# Patient Record
Sex: Male | Born: 1965 | Race: White | Hispanic: No | Marital: Single | Smoking: Never smoker
Health system: Southern US, Community
[De-identification: ages and names within clinical notes are randomized; demographics above are authoritative.]

## PROBLEM LIST (undated history)

## (undated) DIAGNOSIS — E119 Type 2 diabetes mellitus without complications: Secondary | ICD-10-CM

---

## 1999-09-21 ENCOUNTER — Emergency Department (HOSPITAL_COMMUNITY): Admission: EM | Admit: 1999-09-21 | Discharge: 1999-09-21 | Payer: Self-pay | Admitting: *Deleted

## 1999-09-22 ENCOUNTER — Encounter: Payer: Self-pay | Admitting: Emergency Medicine

## 1999-10-13 ENCOUNTER — Encounter: Admission: RE | Admit: 1999-10-13 | Discharge: 2000-01-11 | Payer: Self-pay | Admitting: Family Medicine

## 2017-12-15 ENCOUNTER — Observation Stay
Admission: EM | Admit: 2017-12-15 | Discharge: 2017-12-16 | Disposition: A | Discharge status: Home / Self Care | Payer: 59 | Attending: Internal Medicine | Admitting: Internal Medicine

## 2017-12-15 ENCOUNTER — Other Ambulatory Visit: Payer: Self-pay

## 2017-12-15 ENCOUNTER — Inpatient Hospital Stay: Payer: 59

## 2017-12-15 DIAGNOSIS — Z79899 Other long term (current) drug therapy: Secondary | ICD-10-CM | POA: Diagnosis not present

## 2017-12-15 DIAGNOSIS — E11621 Type 2 diabetes mellitus with foot ulcer: Secondary | ICD-10-CM | POA: Diagnosis not present

## 2017-12-15 DIAGNOSIS — I10 Essential (primary) hypertension: Secondary | ICD-10-CM | POA: Insufficient documentation

## 2017-12-15 DIAGNOSIS — Z7984 Long term (current) use of oral hypoglycemic drugs: Secondary | ICD-10-CM | POA: Insufficient documentation

## 2017-12-15 DIAGNOSIS — L03115 Cellulitis of right lower limb: Secondary | ICD-10-CM | POA: Insufficient documentation

## 2017-12-15 DIAGNOSIS — M869 Osteomyelitis, unspecified: Secondary | ICD-10-CM

## 2017-12-15 DIAGNOSIS — L089 Local infection of the skin and subcutaneous tissue, unspecified: Secondary | ICD-10-CM | POA: Diagnosis present

## 2017-12-15 DIAGNOSIS — E785 Hyperlipidemia, unspecified: Secondary | ICD-10-CM | POA: Insufficient documentation

## 2017-12-15 DIAGNOSIS — Z8249 Family history of ischemic heart disease and other diseases of the circulatory system: Secondary | ICD-10-CM | POA: Diagnosis not present

## 2017-12-15 DIAGNOSIS — L97519 Non-pressure chronic ulcer of other part of right foot with unspecified severity: Secondary | ICD-10-CM | POA: Diagnosis not present

## 2017-12-15 HISTORY — DX: Type 2 diabetes mellitus without complications: E11.9

## 2017-12-15 LAB — COMPREHENSIVE METABOLIC PANEL
ALBUMIN: 4.1 g/dL (ref 3.5–5.0)
ALT: 19 U/L (ref 0–44)
AST: 21 U/L (ref 15–41)
Alkaline Phosphatase: 90 U/L (ref 38–126)
Anion gap: 11 (ref 5–15)
BUN: 14 mg/dL (ref 6–20)
CHLORIDE: 103 mmol/L (ref 98–111)
CO2: 24 mmol/L (ref 22–32)
Calcium: 9.2 mg/dL (ref 8.9–10.3)
Creatinine, Ser: 0.89 mg/dL (ref 0.61–1.24)
GFR calc Af Amer: >60 mL/min (ref >60)
Glucose, Bld: 170 mg/dL — ABNORMAL HIGH (ref 70–99)
Potassium: 3.9 mmol/L (ref 3.5–5.1)
Sodium: 138 mmol/L (ref 135–145)
Total Bilirubin: 1.2 mg/dL (ref 0.3–1.2)
Total Protein: 8.5 g/dL — ABNORMAL HIGH (ref 6.5–8.1)

## 2017-12-15 LAB — CBC WITH DIFFERENTIAL/PLATELET
Basophils Absolute: 0 10*3/uL (ref 0–0.1)
Basophils Relative: 0 %
EOS PCT: 0 %
Eosinophils Absolute: 0 10*3/uL (ref 0–0.7)
HEMATOCRIT: 43.8 % (ref 40.0–52.0)
HEMOGLOBIN: 15.1 g/dL (ref 13.0–18.0)
LYMPHS ABS: 1.2 10*3/uL (ref 1.0–3.6)
LYMPHS PCT: 5 %
MCH: 30.2 pg (ref 26.0–34.0)
MCHC: 34.5 g/dL (ref 32.0–36.0)
MCV: 87.5 fL (ref 80.0–100.0)
Monocytes Absolute: 1.6 10*3/uL — ABNORMAL HIGH (ref 0.2–1.0)
Monocytes Relative: 7 %
Neutro Abs: 21.3 10*3/uL — ABNORMAL HIGH (ref 1.4–6.5)
Neutrophils Relative %: 88 %
PLATELETS: 278 10*3/uL (ref 150–440)
RBC: 5.01 MIL/uL (ref 4.40–5.90)
RDW: 13.4 % (ref 11.5–14.5)
WBC: 24.2 10*3/uL — AB (ref 3.8–10.6)

## 2017-12-15 LAB — GLUCOSE, CAPILLARY
GLUCOSE-CAPILLARY: 125 mg/dL — AB (ref 70–99)
GLUCOSE-CAPILLARY: 105 mg/dL — AB (ref 70–99)
Glucose-Capillary: 161 mg/dL — ABNORMAL HIGH (ref 70–99)
Glucose-Capillary: 141 mg/dL — ABNORMAL HIGH (ref 70–99)

## 2017-12-15 LAB — HEMOGLOBIN A1C
Hgb A1c MFr Bld: 7.9 % — ABNORMAL HIGH (ref 4.8–5.6)
Mean Plasma Glucose: 180.03 mg/dL

## 2017-12-15 LAB — LACTIC ACID, PLASMA
Lactic Acid, Venous: 1.5 mmol/L (ref 0.5–1.9)
Lactic Acid, Venous: 1.4 mmol/L (ref 0.5–1.9)

## 2017-12-15 MED ORDER — POLYETHYLENE GLYCOL 3350 17 G PO PACK: 17.0000 g | PACK | Freq: Every day | ORAL | Status: DC | PRN

## 2017-12-15 MED ORDER — ONDANSETRON HCL 4 MG/2ML IJ SOLN: 4.0000 mg | Freq: Four times a day (QID) | INTRAMUSCULAR | Status: DC | PRN

## 2017-12-15 MED ORDER — ACETAMINOPHEN 650 MG RE SUPP: 650.0000 mg | Freq: Four times a day (QID) | RECTAL | Status: DC | PRN

## 2017-12-15 MED ORDER — LINAGLIPTIN 5 MG PO TABS
5.0000 mg | ORAL_TABLET | Freq: Every day | ORAL | Status: DC
  Administered 2017-12-16: 5 mg via ORAL
  Filled 2017-12-15: qty 1

## 2017-12-15 MED ORDER — VANCOMYCIN HCL IN DEXTROSE 1-5 GM/200ML-% IV SOLN
1000.0000 mg | Freq: Once | INTRAVENOUS | Status: AC
  Administered 2017-12-15: 1000 mg via INTRAVENOUS
  Filled 2017-12-15: qty 200

## 2017-12-15 MED ORDER — PIPERACILLIN-TAZOBACTAM 3.375 G IVPB 30 MIN: 3.3750 g | Freq: Four times a day (QID) | INTRAVENOUS | Status: DC

## 2017-12-15 MED ORDER — PIPERACILLIN-TAZOBACTAM 3.375 G IVPB
3.3750 g | Freq: Three times a day (TID) | INTRAVENOUS | Status: DC
  Administered 2017-12-15 – 2017-12-16 (×2): 3.375 g via INTRAVENOUS
  Filled 2017-12-15 (×3): qty 50

## 2017-12-15 MED ORDER — ATORVASTATIN CALCIUM 10 MG PO TABS
10.0000 mg | ORAL_TABLET | Freq: Every day | ORAL | Status: DC
  Filled 2017-12-15: qty 1

## 2017-12-15 MED ORDER — ENOXAPARIN SODIUM 40 MG/0.4ML ~~LOC~~ SOLN
40.0000 mg | SUBCUTANEOUS | Status: DC
  Administered 2017-12-15: 40 mg via SUBCUTANEOUS
  Filled 2017-12-15: qty 0.4

## 2017-12-15 MED ORDER — ACETAMINOPHEN 325 MG PO TABS: 650.0000 mg | ORAL_TABLET | Freq: Four times a day (QID) | ORAL | Status: DC | PRN

## 2017-12-15 MED ORDER — ONDANSETRON HCL 4 MG PO TABS: 4.0000 mg | ORAL_TABLET | Freq: Four times a day (QID) | ORAL | Status: DC | PRN

## 2017-12-15 MED ORDER — INSULIN ASPART 100 UNIT/ML ~~LOC~~ SOLN: 0.0000 [IU] | Freq: Every day | SUBCUTANEOUS | Status: DC

## 2017-12-15 MED ORDER — HYDRALAZINE HCL 20 MG/ML IJ SOLN: 10.0000 mg | INTRAMUSCULAR | Status: DC | PRN

## 2017-12-15 MED ORDER — LISINOPRIL 5 MG PO TABS
5.0000 mg | ORAL_TABLET | Freq: Every day | ORAL | Status: DC
  Filled 2017-12-15: qty 1

## 2017-12-15 MED ORDER — HYDROCODONE-ACETAMINOPHEN 5-325 MG PO TABS: 1.0000 | ORAL_TABLET | ORAL | Status: DC | PRN

## 2017-12-15 MED ORDER — VANCOMYCIN HCL 10 G IV SOLR
1250.0000 mg | Freq: Three times a day (TID) | INTRAVENOUS | Status: DC
  Administered 2017-12-15 – 2017-12-16 (×3): 1250 mg via INTRAVENOUS
  Filled 2017-12-15 (×5): qty 1250

## 2017-12-15 MED ORDER — INSULIN ASPART 100 UNIT/ML ~~LOC~~ SOLN
0.0000 [IU] | Freq: Three times a day (TID) | SUBCUTANEOUS | Status: DC
  Administered 2017-12-15: 2 [IU] via SUBCUTANEOUS
  Filled 2017-12-15: qty 1

## 2017-12-15 MED ORDER — PIPERACILLIN-TAZOBACTAM 3.375 G IVPB 30 MIN
3.3750 g | Freq: Once | INTRAVENOUS | Status: AC
  Administered 2017-12-15: 3.375 g via INTRAVENOUS
  Filled 2017-12-15: qty 50

## 2017-12-15 NOTE — Progress Notes (Signed)
CODE SEPSIS - PHARMACY COMMUNICATION  **Broad Spectrum Antibiotics should be administered within 1 hour of Sepsis diagnosis**  Time Code Sepsis Called/Page Received: 1207  Antibiotics Ordered: vancomycin, Zosyn  Time of 1st antibiotic administration: 1236  Additional action taken by pharmacy: none required  If necessary, Name of Provider/Nurse Contacted: N/A    Lowella Bandyodney D Azariyah Luhrs ,PharmD Clinical Pharmacist  12/15/2017  12:48 PM

## 2017-12-15 NOTE — Consult Note (Signed)
[facility]                                                      Patient Demographics  Casey Figueroa, is a 52 y.o. male   MRN: [id number]   DOB - 25-Aug-1965  Admit Date - 12/15/2017    Outpatient Primary MD for the patient is [facility] [place] requested in the Hospital by Salary, Avel Peace, MD, On 12/15/2017    Reason for consult diabetic foot infection right   With History of -  Past Medical History:  Diagnosis Date  . Diabetes mellitus without complication ([place])       History reviewed. No pertinent surgical history.  in for   Chief Complaint  Patient presents with  . Foot Injury     HPI  Casey Figueroa  is a 52 y.o. male, patient states that the pulled a little callus type material off the bottom of his right foot few days ago on _0 /07/19 1800  vancomycin (VANCOCIN) 1,250 mg in sodium chloride 0.9 %  250 mL IVPB     1,250 mg 166.7 mL/hr over 90 Minutes Intravenous Every 8 hours 12/15/17 1345     12/15/17 1430  piperacillin-tazobactam (ZOSYN) IVPB 3.375 g  Status:  Discontinued     3.375 g 100 mL/hr over 30 Minutes Intravenous Every 6 hours 12/15/17 1418 12/15/17 1425   12/15/17 1215  piperacillin-tazobactam (ZOSYN) IVPB 3.375 g     3.375 g 100 mL/hr over 30 Minutes Intravenous  Once 12/15/17 1206 12/15/17 1345   12/15/17 1215  vancomycin (VANCOCIN) IVPB 1000 mg/200 mL premix     1,000 mg 200 mL/hr over 60 Minutes Intravenous  Once 12/15/17 1206 12/15/17 1349      Scheduled Meds: . atorvastatin  10 mg Oral q1800  . enoxaparin (LOVENOX) injection  40 mg Subcutaneous Q24H  . insulin aspart  0-15 Units Subcutaneous TID  WC  . insulin aspart  0-5 Units Subcutaneous QHS  . [START ON 12/16/2017] linagliptin  5 mg Oral Daily  . [START ON 12/16/2017] lisinopril  5 mg Oral Daily   Continuous Infusions: . piperacillin-tazobactam (ZOSYN)  IV    . vancomycin     PRN Meds:.acetaminophen **OR** acetaminophen, hydrALAZINE, HYDROcodone-acetaminophen, ondansetron **OR** ondansetron (ZOFRAN) IV, polyethylene glycol  No Known Allergies  Physical Exam  Vitals  Blood pressure (!) 158/83, pulse 86, temperature 98.4 F (36.9 C), temperature source Oral, resp. rate 18, height _0  (1.93 m), weight 104.3 kg (230 lb), SpO2 99 %.  Lower Extremity exam:  Vascular: DP and PT pulses are +2/4 bilateral  Dermatological: Patient has a large blister on the plantar sub-met one area of the right foot there is some cellulitis progressing from that region onto the foot and ankle region.  The blisters partially opened on the medial side and there is just a little bit of drainage there is not a lot of drainage or pus from the region.  Debridement of that loose skin with a 15 scalpel blade was accomplished by me today and excisional process of debridement.  There is no underlying open wound no deep penetration.  Appeared to be very very superficial.  Neurological: Likely peripheral neuropathy associated with diabetes.  Ortho: No gross deformities  Data Review  CBC Recent Labs  Lab 12/15/17 1207  WBC 24.2*  HGB 15.1  HCT 43.8  PLT 278  MCV 87.5  MCH 30.2  MCHC 34.5  RDW 13.4  LYMPHSABS 1.2  MONOABS 1.6*  EOSABS 0.0  BASOSABS 0.0   ------------------------------------------------------------------------------------------------------------------  Chemistries  Recent Labs  Lab 12/15/17 1207  NA 138  K 3.9  CL 103  CO2 24  GLUCOSE 170*  BUN 14  CREATININE 0.89  CALCIUM 9.2  AST 21  ALT 19  ALKPHOS 90  BILITOT 1.2    ------------------------------------------------------------------------------------------------------------------ estimated creatinine clearance is 125.9 mL/min (by C-G formula based on SCr of 0.89 mg/dL). ------------------------------------------------------------------------------------------------------------------ No results for input(s): TSH, T4TOTAL, T3FREE, THYROIDAB in the last 72 hours.  Invalid input(s): FREET3 Urinalysis No results found for: COLORURINE, APPEARANCEUR, LABSPEC, PHURINE, GLUCOSEU, HGBUR, BILIRUBINUR, KETONESUR, PROTEINUR, UROBILINOGEN, NITRITE, LEUKOCYTESUR   Imaging results:   No results found.  Assessment & Plan: White count is notably high.  Cellulitis is present with some inflammation to the region.  Patient denies any fever or chills.  Wound itself is superficial and hopefully will heal in a timely fashion is needs to get this infection under control.  I did take cultures at the day and will send it off.  Some anaerobic culture sent superficial wound.  Currently on Vanco and Zosyn.  I will have a OrthoWedge shoe dispensed to them.  I also ordered x-rays to make sure there is no foreign body.  Also spent time talking to them today about routine diabetic foot care and checkups to make sure he does well and does not have any particular problems with this.  This can be helpful in avoiding problems in the future with daily visual checks. If his infection is stable and significantly improved he can likely go home tomorrow. Active Problems:   Right foot infection   Family Communication: Plan discussed with patient and family.  Albertine Patricia M.D on 12/15/2017 at 5:47 PM  Thank you for the consult, we will follow the patient with you in the Hospital.

## 2017-12-15 NOTE — Progress Notes (Signed)
Pharmacy Antibiotic Note  Casey DraftJohn M Figueroa is a 52 y.o. male admitted on 12/15/2017 with cellulitis.  Pharmacy has been consulted for vancomycin and Zosyn dosing. He has a history of DM,  hypertension, hyperlipidemia, presenting with 4 to 5-day history of worsening right foot infection. Podiatry has been consulted for I&D.  Plan: Vancomycin 1250mg  IV every 8 hours following 1000 mg in the ED after 6 hours.  Goal trough 15-20 mcg/mL. Ke: 0.107, Vd 73L, T1/2: 6.5h, calculated concentrations at steady-state: 32.1/15.1 mcg/mL. Vt prior to the 5th dose Zosyn 3.375g EI q8h  Height: 6\' 4"  (193 cm) Weight: 230 lb (104.3 kg) IBW/kg (Calculated) : 86.8  Temp (24hrs), Avg:98.2 F (36.8 C), Min:98.2 F (36.8 C), Max:98.2 F (36.8 C)  Recent Labs  Lab 12/15/17 1207  WBC 24.2*  CREATININE 0.89  LATICACIDVEN 1.5    Estimated Creatinine Clearance: 125.9 mL/min (by C-G formula based on SCr of 0.89 mg/dL).    No Known Allergies  Antimicrobials this admission: Vancomycin 8/7 >>  Zosyn 8/7 >>   Microbiology results: 8/7 BCx: pending  Thank you for allowing pharmacy to be a part of this patient's care.  Lowella Bandyodney D Angelice Piech, PharmD 12/15/2017 1:03 PM

## 2017-12-15 NOTE — H&P (Signed)
[facility] [place] [facility]   PATIENT NAME: Casey Figueroa    MR#:  [id number]  DATE OF BIRTH:  07/12/1965  DATE OF ADMISSION:  12/15/2017  PRIMARY CARE PHYSICIAN: [facility]   REQUESTING/REFERRING PHYSICIAN:   CHIEF COMPLAINT:   Chief Complaint  Patient presents with  . Foot Injury    HISTORY OF PRESENT ILLNESS: Casey Figueroa  is a 52 y.o. male with a known history of per below which also includes hypertension, hyperlipidemia, presenting with 4 to 5-day history of worsening right foot infection, patient stated that callus on the bottom of his foot came off developed subsequent redness with swelling, in the emergency room patient was noted to have a white count 24,000, patient no apparent distress, resting comfortably in bed, patient now being admitted for acute right diabetic foot infection.  PAST MEDICAL HISTORY:   Past Medical History:  Diagnosis Date  . Diabetes mellitus without complication (HCC)     PAST SURGICAL HISTORY:  None  SOCIAL HISTORY:  Social History   Tobacco Use  . Smoking status: Never Smoker  . Smokeless tobacco: Never Used  Substance Use Topics  . Alcohol use: Not Currently    Frequency: Never    FAMILY HISTORY:  HTN  DRUG ALLERGIES: No Known Allergies  REVIEW OF SYSTEMS:   CONSTITUTIONAL: No fever, fatigue or weakness.  EYES: No blurred or double vision.  EARS, NOSE, AND THROAT: No tinnitus or ear pain.  RESPIRATORY: No cough, shortness of breath, wheezing or hemoptysis.  CARDIOVASCULAR: No chest pain, orthopnea, edema.  GASTROINTESTINAL: No nausea, vomiting, diarrhea or abdominal pain.  GENITOURINARY: No dysuria, hematuria.  ENDOCRINE: No polyuria, nocturia,  HEMATOLOGY: No anemia, easy bruising or bleeding SKIN: Right foot infection  MUSCULOSKELETAL: No joint pain or arthritis.   NEUROLOGIC: No tingling, numbness, weakness.  PSYCHIATRY: No anxiety or depression.   MEDICATIONS AT HOME:  Prior to  Admission medications   Medication Sig Start Date End Date Taking? Authorizing Provider  atorvastatin (LIPITOR) 10 MG tablet Take 1 tablet by mouth daily. 12/14/17  Yes [provider]  JARDIANCE 25 MG TABS tablet Take 1 tablet by mouth daily. 12/02/17  Yes [provider]  lisinopril (PRINIVIL,ZESTRIL) 5 MG tablet Take 1 tablet by mouth daily. 12/13/17  Yes [provider]  metFORMIN (GLUCOPHAGE) 1000 MG tablet Take 1 tablet by mouth 2 (two) times daily. 12/13/17  Yes [provider]  TRADJENTA 5 MG TABS tablet Take 5 mg by mouth daily. 09/29/17  Yes [provider]      PHYSICAL EXAMINATION:   VITAL SIGNS: Blood pressure (!) 142/79, pulse 86, temperature 98.2 F (36.8 C), temperature source Oral, resp. rate 18, height 6\' 4"  (1.93 m), weight 104.3 kg (230 lb), SpO2 96 %.  GENERAL:  52 y.o.-year-old patient lying in the bed with no acute distress.  EYES: Pupils equal, round, reactive to light and accommodation. No scleral icterus. Extraocular muscles intact.  HEENT: Head atraumatic, normocephalic. Oropharynx and nasopharynx clear.  NECK:  Supple, no jugular venous distention. No thyroid enlargement, no tenderness.  LUNGS: Normal breath sounds bilaterally, no wheezing, rales,rhonchi or crepitation. No use of accessory muscles of respiration.  CARDIOVASCULAR: S1, S2 normal. No murmurs, rubs, or gallops.  ABDOMEN: Soft, nontender, nondistended. Bowel sounds present. No organomegaly or mass.  EXTREMITIES: No pedal edema, cyanosis, or clubbing.  NEUROLOGIC: Cranial nerves II through XII are intact. Muscle strength 5/5 in all extremities. Sensation intact. Gait not checked.  PSYCHIATRIC: The  patient is alert and oriented x 3.  SKIN: Acute diabetic foot infection on the right with associated cellulitis extending to the knee, noted drainage from callus on the bottom of the foot    LABORATORY PANEL:   CBC Recent Labs  Lab 12/15/17 1207  WBC 24.2*  HGB  15.1  HCT 43.8  PLT 278  MCV 87.5  MCH 30.2  MCHC 34.5  RDW 13.4  LYMPHSABS 1.2  MONOABS 1.6*  EOSABS 0.0  BASOSABS 0.0   ------------------------------------------------------------------------------------------------------------------  Chemistries  Recent Labs  Lab 12/15/17 1207  NA 138  K 3.9  CL 103  CO2 24  GLUCOSE 170*  BUN 14  CREATININE 0.89  CALCIUM 9.2  AST 21  ALT 19  ALKPHOS 90  BILITOT 1.2   ------------------------------------------------------------------------------------------------------------------ estimated creatinine clearance is 125.9 mL/min (by C-G formula based on SCr of 0.89 mg/dL). ------------------------------------------------------------------------------------------------------------------ No results for input(s): TSH, T4TOTAL, T3FREE, THYROIDAB in the last 72 hours.  Invalid input(s): FREET3   Coagulation profile No results for input(s): INR, PROTIME in the last 168 hours. ------------------------------------------------------------------------------------------------------------------- No results for input(s): DDIMER in the last 72 hours. -------------------------------------------------------------------------------------------------------------------  Cardiac Enzymes No results for input(s): CKMB, TROPONINI, MYOGLOBIN in the last 168 hours.  Invalid input(s): CK ------------------------------------------------------------------------------------------------------------------ Invalid input(s): POCBNP  ---------------------------------------------------------------------------------------------------------------  Urinalysis No results found for: COLORURINE, APPEARANCEUR, LABSPEC, PHURINE, GLUCOSEU, HGBUR, BILIRUBINUR, KETONESUR, PROTEINUR, UROBILINOGEN, NITRITE, LEUKOCYTESUR   RADIOLOGY: No results found.  EKG: No orders found for this or any previous visit.  IMPRESSION AND PLAN: *Acute right diabetic foot  infection Admit to regular nursing floor bed, empiric vancomycin/Zosyn, check wound cultures, follow-up on cultures, podiatry consulted for I&D evaluation  *Chronic diabetes mellitus type 2 Continue home regiment, sliding scale insulin with Accu-Cheks per routine, check hemoglobin C determine level control  *Chronic hypertension Stable Continue home regiment  *Chronic hyperlipidemia, unspecified Stable Continue statin therapy and check lipids in the morning   All the records are reviewed and case discussed with ED provider. Management plans discussed with the patient, family and they are in agreement.  CODE STATUS:full    TOTAL TIME TAKING CARE OF THIS PATIENT: 45 minutes.    Evelena Asa Salary M.D on 12/15/2017   Between 7am to 6pm - Pager - [phone]  After 6pm go to [email or web address] - password [email or web address]  [facility]  Office  [phone]  CC: Primary care physician; [facility]   Note: This dictation was prepared with Dragon dictation along with smaller phrase technology. Any transcriptional errors that result from this process are unintentional.

## 2017-12-15 NOTE — Progress Notes (Signed)
Family Meeting Note  Advance Directive:yes  Today a meeting took place with the Patient.  Patient is able to participate   The following clinical team members were present during this meeting:MD  The following were discussed:Patient's diagnosis: Diabetic foot wound, diabetes, hypertension, hyperlipidemia, Patient's progosis: Unable to determine and Goals for treatment: Full Code  Additional follow-up to be provided: prn  Time spent during discussion:20 minutes  Bertrum SolMontell D Shemar Plemmons, MD

## 2017-12-15 NOTE — ED Triage Notes (Signed)
Pt states he came from riding horses on Saturday, when he was taking his boots off a callus came off and has been putting oinment on it. States he is diabetic.pt has a wound to the left side of foot..Marland Kitchen

## 2017-12-15 NOTE — Progress Notes (Signed)
ADMISSION NOTE:  Pt admitted to room 134 from ED. Pt alert and oriented X4. No complaints of pain. Family at bedside. Right leg elevated on pillow. Pt oriented to room and staff. No needs at this time. Bed in lowest position and call in reach.

## 2017-12-15 NOTE — ED Provider Notes (Signed)
[facility] Emergency Department Provider Note ___________________________________________   First MD Initiated Contact with Patient 12/15/17 1127     (approximate)  I have reviewed the triage vital signs and the nursing notes.   HISTORY  Chief Complaint Foot Injury  HPI EARNIE BECHARD is a 52 y.o. male with a history of diabetes was presented to the emergency department with a right lower extremity swelling and redness.  He says that he had kept his shoes on for 2 days straight because he was needing to nurse a horse who his mother was not giving milk.  He says when he took his shoes off he noticed that he had bruised a callus on his right foot and that he had pus under the skin and redness streaking up his right leg.  Denies any fever.  Patient denies having neuropathy.  Denies pain to the right lower extremity.  Past Medical History:  Diagnosis Date  . Diabetes mellitus without complication (HCC)     There are no active problems to display for this patient.   History reviewed. No pertinent surgical history.  Prior to Admission medications   Medication Sig Start Date End Date Taking? Authorizing Provider  atorvastatin (LIPITOR) 10 MG tablet Take 1 tablet by mouth daily. 12/14/17  Yes [provider]  JARDIANCE 25 MG TABS tablet Take 1 tablet by mouth daily. 12/02/17  Yes [provider]  lisinopril (PRINIVIL,ZESTRIL) 5 MG tablet Take 1 tablet by mouth daily. 12/13/17  Yes [provider]  metFORMIN (GLUCOPHAGE) 1000 MG tablet Take 1 tablet by mouth 2 (two) times daily. 12/13/17  Yes [provider]  TRADJENTA 5 MG TABS tablet Take 5 mg by mouth daily. 09/29/17  Yes [provider]    Allergies Patient has no known allergies.  No family history on file.  Social History Social History   Tobacco Use  . Smoking status: Never Smoker  . Smokeless tobacco: Never Used  Substance Use Topics  . Alcohol use: Not  Currently    Frequency: Never  . Drug use: Not on file    Review of Systems  Constitutional: No fever/chills Eyes: No visual changes. ENT: No sore throat. Cardiovascular: Denies chest pain. Respiratory: Denies shortness of breath. Gastrointestinal: No abdominal pain.  No nausea, no vomiting.  No diarrhea.  No constipation. Genitourinary: Negative for dysuria. Musculoskeletal: Negative for back pain. Skin: As above Neurological: Negative for headaches, focal weakness or numbness.   ____________________________________________   PHYSICAL EXAM:  VITAL SIGNS: ED Triage Vitals [12/15/17 1115]  Enc Vitals Group     BP (!) 142/79     Pulse Rate 86     Resp 18     Temp 98.2 F (36.8 C)     Temp Source Oral     SpO2 96 %     Weight 230 lb (104.3 kg)     Height 6\' 4"  (1.93 m)     Head Circumference      Peak Flow      Pain Score 4     Pain Loc      Pain Edu?      Excl. in GC?     Constitutional: Alert and oriented. Well appearing and in no acute distress. Eyes: Conjunctivae are normal.  Head: Atraumatic. Nose: No congestion/rhinnorhea. Mouth/Throat: Mucous membranes are moist.  Neck: No stridor.   Cardiovascular: Normal rate, regular rhythm. Grossly normal heart sounds.  Respiratory: Normal respiratory effort.  No retractions. Lungs CTAB. Gastrointestinal:  Soft and nontender. No distention.  Musculoskeletal: Moderate edema to the right lower extremity extending from the foot up to the knee. Neurologic:  Normal speech and language. No gross focal neurologic deficits are appreciated. Skin:   To the plantar surface under the right MTPJ there is an area approximately 3 x 4 cm with visualized pus under it.  It appears that this is an area of callus.  Medial to this there is a small amount of dried blood the skin is slightly broken.  There is then streaking erythema from the foot all the way to just proximal to the right knee.   Psychiatric: Mood and affect are normal.  Speech and behavior are normal.  ____________________________________________   LABS (all labs ordered are listed, but only abnormal results are displayed)  Labs Reviewed  GLUCOSE, CAPILLARY - Abnormal; Notable for the following components:      Result Value   Glucose-Capillary 161 (*)    All other components within normal limits  COMPREHENSIVE METABOLIC PANEL - Abnormal; Notable for the following components:   Glucose, Bld 170 (*)    Total Protein 8.5 (*)    All other components within normal limits  CBC WITH DIFFERENTIAL/PLATELET - Abnormal; Notable for the following components:   WBC 24.2 (*)    Neutro Abs 21.3 (*)    Monocytes Absolute 1.6 (*)    All other components within normal limits  CULTURE, BLOOD (ROUTINE X 2)  CULTURE, BLOOD (ROUTINE X 2)  LACTIC ACID, PLASMA  LACTIC ACID, PLASMA   ____________________________________________  EKG   ____________________________________________  RADIOLOGY  ____________________________________________   PROCEDURES  Procedure(s) performed:   Procedures  Critical Care performed:   ____________________________________________   INITIAL IMPRESSION / ASSESSMENT AND PLAN / ED COURSE  Pertinent labs & imaging results that were available during my care of the patient were reviewed by me and considered in my medical decision making (see chart for details).  DDX: Cellulitis, sepsis, kidney failure, hyperglycemia As part of my medical decision making, I reviewed the following data within the electronic MEDICAL RECORD NUMBER Notes from prior ED visits  ----------------------------------------- 12:42 PM on 12/15/2017 -----------------------------------------  Patient at this time found to have a white count of 24.  Patient will need to be admitted to the hospital.  I believe that he has high risk for severe infection and sepsis especially given his diabetes.  Patient aware of need for admission to the hospital.  Signed out to Dr.  Katheren ShamsSalary. ____________________________________________   FINAL CLINICAL IMPRESSION(S) / ED DIAGNOSES  Right lower extremity cellulitis.  NEW MEDICATIONS STARTED DURING THIS VISIT:  New Prescriptions   No medications on file     Note:  This document was prepared using Dragon voice recognition software and may include unintentional dictation errors.     Myrna BlazerSchaevitz, David Matthew, MD 12/15/17 [id number]

## 2017-12-16 LAB — CBC
HEMATOCRIT: 40.7 % (ref 40.0–52.0)
HEMOGLOBIN: 14 g/dL (ref 13.0–18.0)
MCH: 30.4 pg (ref 26.0–34.0)
MCHC: 34.4 g/dL (ref 32.0–36.0)
MCV: 88.4 fL (ref 80.0–100.0)
Platelets: 261 10*3/uL (ref 150–440)
RBC: 4.6 MIL/uL (ref 4.40–5.90)
RDW: 13.2 % (ref 11.5–14.5)
WBC: 20.5 10*3/uL — ABNORMAL HIGH (ref 3.8–10.6)

## 2017-12-16 LAB — GLUCOSE, CAPILLARY
GLUCOSE-CAPILLARY: 88 mg/dL (ref 70–99)
Glucose-Capillary: 98 mg/dL (ref 70–99)

## 2017-12-16 LAB — LIPID PANEL
CHOL/HDL RATIO: 3.2 ratio
Cholesterol: 136 mg/dL (ref 0–200)
HDL: 43 mg/dL (ref >40)
LDL CALC: 75 mg/dL (ref 0–99)
Triglycerides: 92 mg/dL (ref <150)
VLDL: 18 mg/dL (ref 0–40)

## 2017-12-16 LAB — HIV ANTIBODY (ROUTINE TESTING W REFLEX): HIV Screen 4th Generation wRfx: NONREACTIVE

## 2017-12-16 MED ORDER — CIPROFLOXACIN HCL 500 MG PO TABS: 500.0000 mg | ORAL_TABLET | Freq: Two times a day (BID) | ORAL | 0 refills | Status: AC

## 2017-12-16 MED ORDER — AMOXICILLIN-POT CLAVULANATE 875-125 MG PO TABS: 1.0000 | ORAL_TABLET | Freq: Two times a day (BID) | ORAL | 0 refills | Status: AC

## 2017-12-16 NOTE — Progress Notes (Signed)
[facility]                                                      Patient Demographics  Casey Figueroa, is a 52 y.o. male   MRN: [id number]   DOB - 09/12/1965  Admit Date - 12/15/2017    Outpatient Primary MD for the patient is [facility]  Consult requested in the Hospital by Mody, Sital, MD, On 12/16/2017   With History of -  Past Medical History:  Diagnosis Date  . Diabetes mellitus without complication (HCC)       History reviewed. No pertinent surgical history.  in for   Chief Complaint  Patient presents with  . Foot Injury     HPI  Casey Figueroa  is a 52 y.o. male, 1 day status post debridement of the blister and ulcer on the right foot sub-met one area.  He is diabetic and his white count is come down some today.    Review of Systems    In addition to the HPI above,  No Fever-chills, No Headache, No changes with Vision or hearing, No problems swallowing food or Liquids, No Chest pain, Cough or Shortness of Breath, No Abdominal pain, No Nausea or Vommitting, Bowel movements are regular, No Blood in stool or Urine, No dysuria, No new skin rashes or bruises, No new joints pains-aches,  No new weakness, tingling, numbness in any extremity, No recent weight gain or loss, No polyuria, polydypsia or polyphagia, No significant Mental Stressors.  A full 10 point Review of Systems was done, except as stated above, all other Review of Systems were negative.   Social History Social History   Tobacco Use  . Smoking status: Never Smoker  . Smokeless tobacco: Never Used  Substance Use Topics  . Alcohol use: Not Currently    Frequency: Never    Family History No family history on file.  Prior to Admission medications   Medication Sig Start Date End Date Taking? Authorizing Provider  atorvastatin (LIPITOR) 10 MG tablet  Take 1 tablet by mouth daily. 12/14/17  Yes [provider]  JARDIANCE 25 MG TABS tablet Take 1 tablet by mouth daily. 12/02/17  Yes [provider]  lisinopril (PRINIVIL,ZESTRIL) 5 MG tablet Take 1 tablet by mouth daily. 12/13/17  Yes [provider]  metFORMIN (GLUCOPHAGE) 1000 MG tablet Take 1 tablet by mouth 2 (two) times daily. 12/13/17  Yes [provider]  TRADJENTA 5 MG TABS tablet Take 5 mg by mouth daily. 09/29/17  Yes [provider]  amoxicillin-clavulanate (AUGMENTIN) 875-125 MG tablet Take 1 tablet by mouth 2 (two) times daily for 14 days. 12/16/17 12/30/17  Mody, Sital, MD  ciprofloxacin (CIPRO) 500 MG tablet Take 1 tablet (500 mg total) by mouth 2 (two) times daily for 14 days. 12/16/17 12/30/17  Mody, Sital, MD    Anti-infectives (From admission, onward)   Start     Dose/Rate Route Frequency Ordered Stop   12/16/17 0000  ciprofloxacin (CIPRO) 500 MG tablet     500 mg Oral 2 times daily 12/16/17 0922 12/30/17 2359   12/16/17 0000  amoxicillin-clavulanate (AUGMENTIN) 875-125 MG tablet     1 tablet Oral 2 times daily 12/16/17 0922 12/30/17 2359   12/15/17 2000  piperacillin-tazobactam (ZOSYN) IVPB 3.375 g       3.375 g 12.5 mL/hr over 240 Minutes Intravenous Every 8 hours 12/15/17 1345     12/15/17 1800  vancomycin (VANCOCIN) 1,250 mg in sodium chloride 0.9 % 250 mL IVPB     1,250 mg 166.7 mL/hr over 90 Minutes Intravenous Every 8 hours 12/15/17 1345     12/15/17 1430  piperacillin-tazobactam (ZOSYN) IVPB 3.375 g  Status:  Discontinued     3.375 g 100 mL/hr over 30 Minutes Intravenous Every 6 hours 12/15/17 1418 12/15/17 1425   12/15/17 1215  piperacillin-tazobactam (ZOSYN) IVPB 3.375 g     3.375 g 100 mL/hr over 30 Minutes Intravenous  Once 12/15/17 1206 12/15/17 1345   12/15/17 1215  vancomycin (VANCOCIN) IVPB 1000 mg/200 mL premix     1,000 mg 200 mL/hr over 60 Minutes Intravenous  Once 12/15/17 1206 12/15/17 1349      Scheduled Meds: .  atorvastatin  10 mg Oral q1800  . enoxaparin (LOVENOX) injection  40 mg Subcutaneous Q24H  . insulin aspart  0-15 Units Subcutaneous TID WC  . insulin aspart  0-5 Units Subcutaneous QHS  . linagliptin  5 mg Oral Daily  . lisinopril  5 mg Oral Daily   Continuous Infusions: . piperacillin-tazobactam (ZOSYN)  IV Stopped (12/16/17 0915)  . vancomycin Stopped (12/16/17 1036)   PRN Meds:.acetaminophen **OR** acetaminophen, hydrALAZINE, HYDROcodone-acetaminophen, ondansetron **OR** ondansetron (ZOFRAN) IV, polyethylene glycol  No Known Allergies  Physical Exam  Vitals  Blood pressure 130/75, pulse 78, temperature 98.3 F (36.8 C), temperature source Oral, resp. rate 20, height 6' 4" (1.93 m), weight 104.3 kg, SpO2 96 %.  Lower Extremity exam:  Vascular: Palpable bilateral  Dermatological: Plantar wound right foot approximately 2 and half centimeters diameter but it is only to 3 mm deep with a good granular base to the dermal tissues.  Neurological: Some peripheral neuropathy  Ortho: Mild cavovarus foot.  Data Review  CBC Recent Labs  Lab 12/15/17 1207 12/16/17 0409  WBC 24.2* 20.5*  HGB 15.1 14.0  HCT 43.8 40.7  PLT 278 261  MCV 87.5 88.4  MCH 30.2 30.4  MCHC 34.5 34.4  RDW 13.4 13.2  LYMPHSABS 1.2  --   MONOABS 1.6*  --   EOSABS 0.0  --   BASOSABS 0.0  --    ------------------------------------------------------------------------------------------------------------------  Chemistries  Recent Labs  Lab 12/15/17 1207  NA 138  K 3.9  CL 103  CO2 24  GLUCOSE 170*  BUN 14  CREATININE 0.89  CALCIUM 9.2  AST 21  ALT 19  ALKPHOS 90  BILITOT 1.2   ------------------------------------------------------------------------------- Imaging results:   Dg Foot Complete Right  Result Date: 12/15/2017 CLINICAL DATA:  Osteomyelitis. EXAM: RIGHT FOOT COMPLETE - 3+ VIEW COMPARISON:  None. FINDINGS: There is no evidence of fracture or dislocation. There is no evidence of  arthropathy or other focal bone abnormality. Forefoot and midfoot bandage. No subcutaneous gas or radiopaque foreign bodies. Faint sheet like calcification distal plantar soft tissues. No subcutaneous gas or radiopaque foreign bodies. Small calcifications in posterior ankle. Os peroneum. IMPRESSION: No radiographic findings of osteomyelitis or acute osseous process. Electronically Signed   By: Courtnay  Bloomer M.D.   On: 12/15/2017 19:56    Assessment & Plan: Patient is doing well today he does not have the lymphadenopathy that he had yesterday redness and swelling is down considerably in the leg.  Gram stain of the culture showed some positive cocci.  White count is down.  Still somewhat elevated however.  No pain to palpation to the   popliteal fossa and also he states that his groin is no longer sore tender.  He had no fever temperatures and is comfortable alert and well-oriented.  Bandages intact there is little bit of drainage on the region.  Does have his OrthoWedge shoe.   Plan: Redressed the area today with heavy gauze dressing needs to do this on a daily basis.  Is also to wear the OrthoWedge shoe anytime his foot is on the floor where the standing or walking or sitting.  Instructed him how to use that appropriately.  Should take some oral antibiotics until cover staph and strep positive cocci.  I will see him back in my office next week for reevaluation.  Recommend he stay out of work and rest this until I see him next week.  Active Problems:   Right foot infection   Family Communication: Plan discussed with patient   Matthew Troxler M.D on 12/16/2017 at 12:50 PM  Thank you for the consult, we will follow the patient with you in the Hospital.   

## 2017-12-16 NOTE — Care Management Note (Signed)
Case Management Note  Patient Details  Name: Casey Figueroa MRN: [id number] Date of Birth: Oct 13, 1965  Subjective/Objective:     Met with patient at bedside to discuss discharge planning and complete assessment. Patient alert, orient and independent. He is ambulatory and active. He lives with his girlfriend and works full time at [employer]. He states he and his girl friend are very capable of changing his right foot dressing and buying the needed supplies. He verbalized full understanding of foot care and control of his diabetes. He ambulates without difficulty and understands that is is not to ambulate without his orthopedic shoe at any time. Case discussed with attending. RNCM explained that patient does not feel like he is in need of any home health at this time and RNCM is in agreement with this POC. Attending agrees. Case closed.                 Action/Plan:   Expected Discharge Date:  12/16/17               Expected Discharge Plan:  Home/Self Care  In-House Referral:     Discharge planning Services  CM Consult  Post Acute Care Choice:    Choice offered to:     DME Arranged:    DME Agency:     HH Arranged:  Patient Refused [place] Agency:     Status of Service:  Completed, signed off  If discussed at [employer] of Stay Meetings, dates discussed:    Additional Comments:  Jolly Mango, RN 12/16/2017, 9:52 AM

## 2017-12-16 NOTE — Discharge Summary (Signed)
[facility] [place] [facility]   PATIENT NAME: Casey Figueroa    MR#:  [id number]  DATE OF BIRTH:  21-Oct-1965  DATE OF ADMISSION:  12/15/2017 ADMITTING PHYSICIAN: Bertrum Sol, MD  DATE OF DISCHARGE: December 16, 2017  PRIMARY CARE PHYSICIAN: [facility]    ADMISSION DIAGNOSIS:  Cellulitis of right lower extremity [L03.115]  DISCHARGE DIAGNOSIS:  Active Problems:   Right foot infection   SECONDARY DIAGNOSIS:   Past Medical History:  Diagnosis Date  . Diabetes mellitus without complication [facility])     HOSPITAL COURSE:   52 year old male with history of diabetes who presents to the emergency room complaining of pain of the right foot.  1.  Cellulitis right foot without evidence of abscess or osteomyelitis: Patient was evaluated by podiatry.  White blood cell count is improved.  Cultures are growing gram-positive cocci.  Patient will be discharged on oral ciprofloxacin Augmentin.  Patient was instructed to use wet-to-dry dressings with heavy gauze daily.  He was instructed on using his Ortho shoe to ambulate. He will follow-up with podiatry next Monday.  This is a superficial wound and hopefully should heal within the next week or 2.  2 diabetes: Patient will continue outpatient medications.  3.  Essential hypertension: Continue lisinopril  4.  Hyperlipidemia: Continue statin    DISCHARGE CONDITIONS AND DIET:  Stable for discharge on diabetic diet  CONSULTS OBTAINED:    DRUG ALLERGIES:  No Known Allergies  DISCHARGE MEDICATIONS:   Allergies as of 12/16/2017   No Known Allergies     Medication List    TAKE these medications   amoxicillin-clavulanate 875-125 MG tablet Commonly known as:  AUGMENTIN Take 1 tablet by mouth 2 (two) times daily for 14 days.   atorvastatin 10 MG tablet Commonly known as:  LIPITOR Take 1 tablet by mouth daily.   ciprofloxacin 500 MG tablet Commonly known as:  CIPRO Take 1 tablet (500 mg total)  by mouth 2 (two) times daily for 14 days.   JARDIANCE 25 MG Tabs tablet Generic drug:  empagliflozin Take 1 tablet by mouth daily.   lisinopril 5 MG tablet Commonly known as:  PRINIVIL,ZESTRIL Take 1 tablet by mouth daily.   metFORMIN 1000 MG tablet Commonly known as:  GLUCOPHAGE Take 1 tablet by mouth 2 (two) times daily.   TRADJENTA 5 MG Tabs tablet Generic drug:  linagliptin Take 5 mg by mouth daily.            Discharge Care Instructions  (From admission, onward)         Start     Ordered   12/16/17 0000  Discharge wound care:    Comments:  Daily wet-to-dry dressing with heavy gauze   12/16/17 0922            Today   CHIEF COMPLAINT:  Patient doing well this morning.   VITAL SIGNS:  Blood pressure 130/75, pulse 78, temperature 98.3 F (36.8 C), temperature source Oral, resp. rate 20, height 6\' 4"  (1.93 m), weight 104.3 kg, SpO2 96 %.   REVIEW OF SYSTEMS:  Review of Systems  Constitutional: Negative.  Negative for chills, fever and malaise/fatigue.  HENT: Negative.  Negative for ear discharge, ear pain, hearing loss, nosebleeds and sore throat.   Eyes: Negative.  Negative for blurred vision and pain.  Respiratory: Negative.  Negative for cough, hemoptysis, shortness of breath and wheezing.   Cardiovascular: Negative.  Negative for chest pain, palpitations and leg swelling.  Gastrointestinal: Negative.  Negative for abdominal pain, blood in stool, diarrhea, nausea and vomiting.  Genitourinary: Negative.  Negative for dysuria.  Musculoskeletal: Negative.  Negative for back pain.  Skin: Negative.        Diabetic foot wound  Neurological: Negative for dizziness, tremors, speech change, focal weakness, seizures and headaches.  Endo/Heme/Allergies: Negative.  Does not bruise/bleed easily.  Psychiatric/Behavioral: Negative.  Negative for depression, hallucinations and suicidal ideas.     PHYSICAL EXAMINATION:  GENERAL:  52 y.o.-year-old patient lying  in the bed with no acute distress.  NECK:  Supple, no jugular venous distention. No thyroid enlargement, no tenderness.  LUNGS: Normal breath sounds bilaterally, no wheezing, rales,rhonchi  No use of accessory muscles of respiration.  CARDIOVASCULAR: S1, S2 normal. No murmurs, rubs, or gallops.  ABDOMEN: Soft, non-tender, non-distended. Bowel sounds present. No organomegaly or mass.  EXTREMITIES: No pedal edema, cyanosis, or clubbing.  PSYCHIATRIC: The patient is alert and oriented x 3.  SKIN: Right foot is wrapped  DATA REVIEW:   CBC Recent Labs  Lab 12/16/17 0409  WBC 20.5*  HGB 14.0  HCT 40.7  PLT 261    Chemistries  Recent Labs  Lab 12/15/17 1207  NA 138  K 3.9  CL 103  CO2 24  GLUCOSE 170*  BUN 14  CREATININE 0.89  CALCIUM 9.2  AST 21  ALT 19  ALKPHOS 90  BILITOT 1.2    Cardiac Enzymes No results for input(s): TROPONINI in the last 168 hours.  Microbiology Results  @[email or web address] @  RADIOLOGY:  Dg Foot Complete Right  Result Date: 12/15/2017 CLINICAL DATA:  Osteomyelitis. EXAM: RIGHT FOOT COMPLETE - 3+ VIEW COMPARISON:  None. FINDINGS: There is no evidence of fracture or dislocation. There is no evidence of arthropathy or other focal bone abnormality. Forefoot and midfoot bandage. No subcutaneous gas or radiopaque foreign bodies. Faint sheet like calcification distal plantar soft tissues. No subcutaneous gas or radiopaque foreign bodies. Small calcifications in posterior ankle. Os peroneum. IMPRESSION: No radiographic findings of osteomyelitis or acute osseous process. Electronically Signed   By: Awilda Metroourtnay  Bloomer M.D.   On: 12/15/2017 19:56      Allergies as of 12/16/2017   No Known Allergies     Medication List    TAKE these medications   amoxicillin-clavulanate 875-125 MG tablet Commonly known as:  AUGMENTIN Take 1 tablet by mouth 2 (two) times daily for 14 days.   atorvastatin 10 MG tablet Commonly known as:  LIPITOR Take 1 tablet by mouth daily.    ciprofloxacin 500 MG tablet Commonly known as:  CIPRO Take 1 tablet (500 mg total) by mouth 2 (two) times daily for 14 days.   JARDIANCE 25 MG Tabs tablet Generic drug:  empagliflozin Take 1 tablet by mouth daily.   lisinopril 5 MG tablet Commonly known as:  PRINIVIL,ZESTRIL Take 1 tablet by mouth daily.   metFORMIN 1000 MG tablet Commonly known as:  GLUCOPHAGE Take 1 tablet by mouth 2 (two) times daily.   TRADJENTA 5 MG Tabs tablet Generic drug:  linagliptin Take 5 mg by mouth daily.            Discharge Care Instructions  (From admission, onward)         Start     Ordered   12/16/17 0000  Discharge wound care:    Comments:  Daily wet-to-dry dressing with heavy gauze   12/16/17 [id number]             Management plans discussed with the  patient and he is in agreement. Stable for discharge home  Patient should follow up with Dr. Orland Jarred  CODE STATUS:     Code Status Orders  (From admission, onward)         Start     Ordered   12/15/17 1419  Full code  Continuous     12/15/17 1418        Code Status History    This patient has a current code status but no historical code status.      TOTAL TIME TAKING CARE OF THIS PATIENT: 38 minutes.    Note: This dictation was prepared with Dragon dictation along with smaller phrase technology. Any transcriptional errors that result from this process are unintentional.  Sung Parodi M.D on 12/16/2017 at 9:23 AM  Between 7am to 6pm - Pager - [phone] After 6pm go to [email or web address] - Social research officer, government  [facility]  Office  [phone]  CC: Primary care physician; [facility]

## 2017-12-18 LAB — AEROBIC CULTURE W GRAM STAIN (SUPERFICIAL SPECIMEN)

## 2017-12-18 LAB — AEROBIC CULTURE  (SUPERFICIAL SPECIMEN): SPECIAL REQUESTS: NORMAL

## 2017-12-20 ENCOUNTER — Telehealth: Payer: Self-pay

## 2017-12-20 LAB — CULTURE, BLOOD (ROUTINE X 2)
CULTURE: NO GROWTH
Culture: NO GROWTH
Special Requests: ADEQUATE
Special Requests: ADEQUATE

## 2017-12-20 NOTE — Telephone Encounter (Signed)
Flagged on EMMI report for having unfilled prescriptions.  Called and spoke with patient.  He mentioned he was able to get everything filled and has already completed his follow up appointment with Dr. Orland Jarredroxler.  Doing well and has no questions or concerns at this time.  I thanked him for his time and informed him he would receive one more automated call checking on him in the next few days.

## 2018-08-05 ENCOUNTER — Encounter (HOSPITAL_COMMUNITY): Payer: Self-pay | Admitting: *Deleted

## 2018-08-05 ENCOUNTER — Other Ambulatory Visit: Payer: Self-pay

## 2018-08-05 ENCOUNTER — Observation Stay (HOSPITAL_COMMUNITY): Payer: 59 | Admitting: Certified Registered Nurse Anesthetist

## 2018-08-05 ENCOUNTER — Encounter (HOSPITAL_COMMUNITY)
Admission: EM | Disposition: A | Discharge status: Home / Self Care | Payer: Self-pay | Source: Home / Self Care | Attending: Emergency Medicine

## 2018-08-05 ENCOUNTER — Observation Stay (HOSPITAL_COMMUNITY)
Admission: EM | Admit: 2018-08-05 | Discharge: 2018-08-06 | Disposition: A | Discharge status: Home / Self Care | Payer: 59 | Attending: Surgery | Admitting: Surgery

## 2018-08-05 DIAGNOSIS — K3580 Unspecified acute appendicitis: Secondary | ICD-10-CM | POA: Diagnosis not present

## 2018-08-05 DIAGNOSIS — K37 Unspecified appendicitis: Secondary | ICD-10-CM | POA: Diagnosis present

## 2018-08-05 DIAGNOSIS — Z79899 Other long term (current) drug therapy: Secondary | ICD-10-CM | POA: Diagnosis not present

## 2018-08-05 DIAGNOSIS — K35891 Other acute appendicitis without perforation, with gangrene: Secondary | ICD-10-CM | POA: Diagnosis not present

## 2018-08-05 DIAGNOSIS — E119 Type 2 diabetes mellitus without complications: Secondary | ICD-10-CM | POA: Diagnosis not present

## 2018-08-05 DIAGNOSIS — Z7984 Long term (current) use of oral hypoglycemic drugs: Secondary | ICD-10-CM | POA: Diagnosis not present

## 2018-08-05 HISTORY — PX: LAPAROSCOPIC APPENDECTOMY: SHX408

## 2018-08-05 LAB — CBC WITH DIFFERENTIAL/PLATELET
Abs Immature Granulocytes: 0.18 10*3/uL — ABNORMAL HIGH (ref 0.00–0.07)
BASOS ABS: 0.1 10*3/uL (ref 0.0–0.1)
BASOS PCT: 0 %
Eosinophils Absolute: 0.1 10*3/uL (ref 0.0–0.5)
Eosinophils Relative: 0 %
HCT: 45.5 % (ref 39.0–52.0)
HEMOGLOBIN: 15.2 g/dL (ref 13.0–17.0)
Immature Granulocytes: 1 %
LYMPHS PCT: 5 %
Lymphs Abs: 1.3 10*3/uL (ref 0.7–4.0)
MCH: 29.3 pg (ref 26.0–34.0)
MCHC: 33.4 g/dL (ref 30.0–36.0)
MCV: 87.7 fL (ref 80.0–100.0)
MONO ABS: 2 10*3/uL — AB (ref 0.1–1.0)
Monocytes Relative: 8 %
NRBC: 0 % (ref 0.0–0.2)
Neutro Abs: 21.6 10*3/uL — ABNORMAL HIGH (ref 1.7–7.7)
Neutrophils Relative %: 86 %
PLATELETS: 253 10*3/uL (ref 150–400)
RBC: 5.19 MIL/uL (ref 4.22–5.81)
RDW: 12.4 % (ref 11.5–15.5)
WBC: 25.2 10*3/uL — AB (ref 4.0–10.5)

## 2018-08-05 LAB — COMPREHENSIVE METABOLIC PANEL
ALT: 21 U/L (ref 0–44)
ANION GAP: 14 (ref 5–15)
AST: 21 U/L (ref 15–41)
Albumin: 3.9 g/dL (ref 3.5–5.0)
Alkaline Phosphatase: 64 U/L (ref 38–126)
BILIRUBIN TOTAL: 1.7 mg/dL — AB (ref 0.3–1.2)
BUN: 19 mg/dL (ref 6–20)
CHLORIDE: 103 mmol/L (ref 98–111)
CO2: 20 mmol/L — ABNORMAL LOW (ref 22–32)
Calcium: 9.2 mg/dL (ref 8.9–10.3)
Creatinine, Ser: 1.17 mg/dL (ref 0.61–1.24)
GFR calc Af Amer: >60 mL/min (ref >60)
GLUCOSE: 168 mg/dL — AB (ref 70–99)
POTASSIUM: 4.1 mmol/L (ref 3.5–5.1)
Sodium: 137 mmol/L (ref 135–145)
TOTAL PROTEIN: 7.9 g/dL (ref 6.5–8.1)

## 2018-08-05 LAB — GLUCOSE, CAPILLARY
Glucose-Capillary: 136 mg/dL — ABNORMAL HIGH (ref 70–99)
Glucose-Capillary: 150 mg/dL — ABNORMAL HIGH (ref 70–99)
Glucose-Capillary: 151 mg/dL — ABNORMAL HIGH (ref 70–99)
Glucose-Capillary: 273 mg/dL — ABNORMAL HIGH (ref 70–99)

## 2018-08-05 LAB — URINALYSIS, ROUTINE W REFLEX MICROSCOPIC
Bilirubin Urine: NEGATIVE
Ketones, ur: 20 mg/dL — AB
Leukocytes,Ua: NEGATIVE
Nitrite: NEGATIVE
PH: 5 (ref 5.0–8.0)
Protein, ur: NEGATIVE mg/dL
SPECIFIC GRAVITY, URINE: 1.032 — AB (ref 1.005–1.030)

## 2018-08-05 LAB — LACTIC ACID, PLASMA
LACTIC ACID, VENOUS: 1.9 mmol/L (ref 0.5–1.9)
Lactic Acid, Venous: 1.4 mmol/L (ref 0.5–1.9)

## 2018-08-05 LAB — LIPASE, BLOOD: Lipase: 26 U/L (ref 11–51)

## 2018-08-05 LAB — CREATININE, SERUM
Creatinine, Ser: 1.23 mg/dL (ref 0.61–1.24)
GFR calc Af Amer: >60 mL/min (ref >60)
GFR calc non Af Amer: >60 mL/min (ref >60)

## 2018-08-05 LAB — PROTIME-INR
INR: 1.1 (ref 0.8–1.2)
PROTHROMBIN TIME: 14 s (ref 11.4–15.2)

## 2018-08-05 SURGERY — APPENDECTOMY, LAPAROSCOPIC
Anesthesia: General | Site: Abdomen

## 2018-08-05 MED ORDER — ROCURONIUM BROMIDE 50 MG/5ML IV SOSY
PREFILLED_SYRINGE | INTRAVENOUS | Status: AC
  Filled 2018-08-05: qty 5

## 2018-08-05 MED ORDER — PROPOFOL 10 MG/ML IV BOLUS
INTRAVENOUS | Status: AC
  Filled 2018-08-05: qty 20

## 2018-08-05 MED ORDER — POLYETHYLENE GLYCOL 3350 17 G PO PACK: 17.0000 g | PACK | Freq: Every day | ORAL | Status: DC | PRN

## 2018-08-05 MED ORDER — ONDANSETRON 4 MG PO TBDP: 4.0000 mg | ORAL_TABLET | Freq: Four times a day (QID) | ORAL | Status: DC | PRN

## 2018-08-05 MED ORDER — ACETAMINOPHEN 325 MG PO TABS
650.0000 mg | ORAL_TABLET | Freq: Four times a day (QID) | ORAL | Status: DC
  Administered 2018-08-05 – 2018-08-06 (×3): 650 mg via ORAL
  Filled 2018-08-05 (×3): qty 2

## 2018-08-05 MED ORDER — MORPHINE SULFATE (PF) 2 MG/ML IV SOLN: 2.0000 mg | INTRAVENOUS | Status: DC | PRN

## 2018-08-05 MED ORDER — 0.9 % SODIUM CHLORIDE (POUR BTL) OPTIME
TOPICAL | Status: DC | PRN
  Administered 2018-08-05: 1000 mL

## 2018-08-05 MED ORDER — ENOXAPARIN SODIUM 40 MG/0.4ML ~~LOC~~ SOLN
40.0000 mg | SUBCUTANEOUS | Status: DC
  Administered 2018-08-06: 40 mg via SUBCUTANEOUS
  Filled 2018-08-05: qty 0.4

## 2018-08-05 MED ORDER — ROCURONIUM BROMIDE 10 MG/ML (PF) SYRINGE
PREFILLED_SYRINGE | INTRAVENOUS | Status: DC | PRN
  Administered 2018-08-05: 20 mg via INTRAVENOUS
  Administered 2018-08-05: 50 mg via INTRAVENOUS
  Administered 2018-08-05: 30 mg via INTRAVENOUS

## 2018-08-05 MED ORDER — PROPOFOL 10 MG/ML IV BOLUS
INTRAVENOUS | Status: DC | PRN
  Administered 2018-08-05: 160 mg via INTRAVENOUS

## 2018-08-05 MED ORDER — ONDANSETRON HCL 4 MG/2ML IJ SOLN
INTRAMUSCULAR | Status: AC
  Filled 2018-08-05: qty 2

## 2018-08-05 MED ORDER — EPHEDRINE 5 MG/ML INJ
INTRAVENOUS | Status: AC
  Filled 2018-08-05: qty 10

## 2018-08-05 MED ORDER — MIDAZOLAM HCL 2 MG/2ML IJ SOLN
INTRAMUSCULAR | Status: AC
  Filled 2018-08-05: qty 2

## 2018-08-05 MED ORDER — DIPHENHYDRAMINE HCL 25 MG PO CAPS: 25.0000 mg | ORAL_CAPSULE | Freq: Four times a day (QID) | ORAL | Status: DC | PRN

## 2018-08-05 MED ORDER — FENTANYL CITRATE (PF) 100 MCG/2ML IJ SOLN
25.0000 ug | INTRAMUSCULAR | Status: DC | PRN
  Administered 2018-08-05: 25 ug via INTRAVENOUS

## 2018-08-05 MED ORDER — BUPIVACAINE-EPINEPHRINE 0.25% -1:200000 IJ SOLN
INTRAMUSCULAR | Status: DC | PRN
  Administered 2018-08-05: 14 mL

## 2018-08-05 MED ORDER — DOCUSATE SODIUM 100 MG PO CAPS
100.0000 mg | ORAL_CAPSULE | Freq: Two times a day (BID) | ORAL | Status: DC
  Administered 2018-08-06: 100 mg via ORAL
  Filled 2018-08-05: qty 1

## 2018-08-05 MED ORDER — PIPERACILLIN-TAZOBACTAM 3.375 G IVPB 30 MIN
3.3750 g | Freq: Once | INTRAVENOUS | Status: AC
  Administered 2018-08-05: 3.375 g via INTRAVENOUS
  Filled 2018-08-05: qty 50

## 2018-08-05 MED ORDER — LIDOCAINE 2% (20 MG/ML) 5 ML SYRINGE
INTRAMUSCULAR | Status: AC
  Filled 2018-08-05: qty 10

## 2018-08-05 MED ORDER — SUCCINYLCHOLINE CHLORIDE 200 MG/10ML IV SOSY
PREFILLED_SYRINGE | INTRAVENOUS | Status: AC
  Filled 2018-08-05: qty 10

## 2018-08-05 MED ORDER — OXYCODONE HCL 5 MG PO TABS: 5.0000 mg | ORAL_TABLET | ORAL | Status: DC | PRN

## 2018-08-05 MED ORDER — SUGAMMADEX SODIUM 200 MG/2ML IV SOLN
INTRAVENOUS | Status: DC | PRN
  Administered 2018-08-05: 200 mg via INTRAVENOUS

## 2018-08-05 MED ORDER — FENTANYL CITRATE (PF) 100 MCG/2ML IJ SOLN
INTRAMUSCULAR | Status: AC
  Filled 2018-08-05: qty 2

## 2018-08-05 MED ORDER — PIPERACILLIN-TAZOBACTAM 3.375 G IVPB 30 MIN: 3.3750 g | Freq: Three times a day (TID) | INTRAVENOUS | Status: DC

## 2018-08-05 MED ORDER — PANTOPRAZOLE SODIUM 40 MG IV SOLR
40.0000 mg | Freq: Every day | INTRAVENOUS | Status: DC
  Administered 2018-08-05: 40 mg via INTRAVENOUS
  Filled 2018-08-05: qty 40

## 2018-08-05 MED ORDER — ONDANSETRON HCL 4 MG/2ML IJ SOLN
4.0000 mg | Freq: Four times a day (QID) | INTRAMUSCULAR | Status: DC | PRN
  Administered 2018-08-05: 4 mg via INTRAVENOUS
  Filled 2018-08-05: qty 2

## 2018-08-05 MED ORDER — HYDROMORPHONE HCL 1 MG/ML IJ SOLN
1.0000 mg | Freq: Once | INTRAMUSCULAR | Status: AC
Start: 2018-08-05 — End: 2018-08-05
  Administered 2018-08-05: 1 mg via INTRAVENOUS
  Filled 2018-08-05: qty 1

## 2018-08-05 MED ORDER — DEXAMETHASONE SODIUM PHOSPHATE 10 MG/ML IJ SOLN
INTRAMUSCULAR | Status: DC | PRN
  Administered 2018-08-05: 5 mg via INTRAVENOUS

## 2018-08-05 MED ORDER — LACTATED RINGERS IV SOLN
INTRAVENOUS | Status: DC
  Administered 2018-08-05 (×2): via INTRAVENOUS

## 2018-08-05 MED ORDER — FENTANYL CITRATE (PF) 250 MCG/5ML IJ SOLN
INTRAMUSCULAR | Status: DC | PRN
  Administered 2018-08-05 (×2): 50 ug via INTRAVENOUS

## 2018-08-05 MED ORDER — LACTATED RINGERS IV BOLUS
1000.0000 mL | Freq: Once | INTRAVENOUS | Status: AC
  Administered 2018-08-05: 1000 mL via INTRAVENOUS

## 2018-08-05 MED ORDER — FENTANYL CITRATE (PF) 250 MCG/5ML IJ SOLN
INTRAMUSCULAR | Status: AC
  Filled 2018-08-05: qty 5

## 2018-08-05 MED ORDER — MIDAZOLAM HCL 5 MG/5ML IJ SOLN
INTRAMUSCULAR | Status: DC | PRN
  Administered 2018-08-05: 2 mg via INTRAVENOUS

## 2018-08-05 MED ORDER — ACETAMINOPHEN 500 MG PO TABS: 1000.0000 mg | ORAL_TABLET | Freq: Four times a day (QID) | ORAL | Status: DC

## 2018-08-05 MED ORDER — INSULIN ASPART 100 UNIT/ML ~~LOC~~ SOLN
0.0000 [IU] | Freq: Three times a day (TID) | SUBCUTANEOUS | Status: DC
  Administered 2018-08-06: 2 [IU] via SUBCUTANEOUS

## 2018-08-05 MED ORDER — HYDRALAZINE HCL 20 MG/ML IJ SOLN: 10.0000 mg | INTRAMUSCULAR | Status: DC | PRN

## 2018-08-05 MED ORDER — BUPIVACAINE-EPINEPHRINE (PF) 0.25% -1:200000 IJ SOLN
INTRAMUSCULAR | Status: AC
  Filled 2018-08-05: qty 30

## 2018-08-05 MED ORDER — SODIUM CHLORIDE 0.9 % IV SOLN: INTRAVENOUS | Status: DC

## 2018-08-05 MED ORDER — LIDOCAINE 2% (20 MG/ML) 5 ML SYRINGE
INTRAMUSCULAR | Status: DC | PRN
  Administered 2018-08-05: 80 mg via INTRAVENOUS

## 2018-08-05 MED ORDER — PIPERACILLIN-TAZOBACTAM 3.375 G IVPB
3.3750 g | Freq: Three times a day (TID) | INTRAVENOUS | Status: DC
  Administered 2018-08-05 – 2018-08-06 (×2): 3.375 g via INTRAVENOUS
  Filled 2018-08-05 (×2): qty 50

## 2018-08-05 MED ORDER — DIPHENHYDRAMINE HCL 50 MG/ML IJ SOLN: 25.0000 mg | Freq: Four times a day (QID) | INTRAMUSCULAR | Status: DC | PRN

## 2018-08-05 MED ORDER — DEXAMETHASONE SODIUM PHOSPHATE 10 MG/ML IJ SOLN
INTRAMUSCULAR | Status: AC
  Filled 2018-08-05: qty 1

## 2018-08-05 MED ORDER — ONDANSETRON HCL 4 MG/2ML IJ SOLN
INTRAMUSCULAR | Status: DC | PRN
  Administered 2018-08-05: 4 mg via INTRAVENOUS

## 2018-08-05 MED ORDER — SODIUM CHLORIDE 0.9 % IR SOLN
Status: DC | PRN
  Administered 2018-08-05: 1

## 2018-08-05 MED ORDER — KCL IN DEXTROSE-NACL 10-5-0.45 MEQ/L-%-% IV SOLN
INTRAVENOUS | Status: DC
  Filled 2018-08-05: qty 1000

## 2018-08-05 SURGICAL SUPPLY — 39 items
APPLIER CLIP 5 13 M/L LIGAMAX5 (MISCELLANEOUS)
BLADE CLIPPER SURG (BLADE) IMPLANT
CANISTER SUCT 3000ML PPV (MISCELLANEOUS) ×3 IMPLANT
CHLORAPREP W/TINT 26ML (MISCELLANEOUS) ×3 IMPLANT
CLIP APPLIE 5 13 M/L LIGAMAX5 (MISCELLANEOUS) IMPLANT
COVER SURGICAL LIGHT HANDLE (MISCELLANEOUS) ×3 IMPLANT
COVER WAND RF STERILE (DRAPES) ×3 IMPLANT
CUTTER FLEX LINEAR 45M (STAPLE) ×3 IMPLANT
DERMABOND ADVANCED (GAUZE/BANDAGES/DRESSINGS) ×2
DERMABOND ADVANCED .7 DNX12 (GAUZE/BANDAGES/DRESSINGS) ×1 IMPLANT
ELECT REM PT RETURN 9FT ADLT (ELECTROSURGICAL) ×3
ELECTRODE REM PT RTRN 9FT ADLT (ELECTROSURGICAL) ×1 IMPLANT
GLOVE BIOGEL M STRL SZ7.5 (GLOVE) ×3 IMPLANT
GLOVE INDICATOR 8.0 STRL GRN (GLOVE) ×3 IMPLANT
GOWN STRL REUS W/ TWL LRG LVL3 (GOWN DISPOSABLE) ×2 IMPLANT
GOWN STRL REUS W/ TWL XL LVL3 (GOWN DISPOSABLE) ×1 IMPLANT
GOWN STRL REUS W/TWL LRG LVL3 (GOWN DISPOSABLE) ×4
GOWN STRL REUS W/TWL XL LVL3 (GOWN DISPOSABLE) ×2
KIT BASIN OR (CUSTOM PROCEDURE TRAY) ×3 IMPLANT
KIT TURNOVER KIT B (KITS) ×3 IMPLANT
NS IRRIG 1000ML POUR BTL (IV SOLUTION) ×3 IMPLANT
PAD ARMBOARD 7.5X6 YLW CONV (MISCELLANEOUS) ×6 IMPLANT
POUCH SPECIMEN RETRIEVAL 10MM (ENDOMECHANICALS) ×3 IMPLANT
RELOAD 45 VASCULAR/THIN (ENDOMECHANICALS) IMPLANT
RELOAD STAPLE TA45 3.5 REG BLU (ENDOMECHANICALS) IMPLANT
SCISSORS LAP 5X35 DISP (ENDOMECHANICALS) IMPLANT
SET IRRIG TUBING LAPAROSCOPIC (IRRIGATION / IRRIGATOR) ×3 IMPLANT
SET TUBE SMOKE EVAC HIGH FLOW (TUBING) ×3 IMPLANT
SHEARS HARMONIC ACE PLUS 36CM (ENDOMECHANICALS) IMPLANT
SLEEVE ENDOPATH XCEL 5M (ENDOMECHANICALS) ×3 IMPLANT
SPECIMEN JAR SMALL (MISCELLANEOUS) ×3 IMPLANT
SUT MNCRL AB 4-0 PS2 18 (SUTURE) ×3 IMPLANT
TOWEL OR 17X24 6PK STRL BLUE (TOWEL DISPOSABLE) ×3 IMPLANT
TOWEL OR 17X26 10 PK STRL BLUE (TOWEL DISPOSABLE) ×3 IMPLANT
TRAY FOLEY CATH SILVER 16FR (SET/KITS/TRAYS/PACK) ×3 IMPLANT
TRAY LAPAROSCOPIC MC (CUSTOM PROCEDURE TRAY) ×3 IMPLANT
TROCAR XCEL BLUNT TIP 100MML (ENDOMECHANICALS) ×3 IMPLANT
TROCAR XCEL NON-BLD 5MMX100MML (ENDOMECHANICALS) ×3 IMPLANT
WATER STERILE IRR 1000ML POUR (IV SOLUTION) ×3 IMPLANT

## 2018-08-05 NOTE — Transfer of Care (Signed)
Immediate Anesthesia Transfer of Care Note  Patient: Casey Figueroa  Procedure(s) Performed: APPENDECTOMY LAPAROSCOPIC (N/A Abdomen)  Patient Location: PACU  Anesthesia Type:General  Level of Consciousness: drowsy  Airway & Oxygen Therapy: Patient Spontanous Breathing and Patient connected to face mask oxygen  Post-op Assessment: Report given to RN  Post vital signs: Reviewed and stable  Last Vitals:  Vitals Value Taken Time  BP    Temp    Pulse    Resp    SpO2      Last Pain:  Vitals:   08/05/18 1325  PainSc: 1          Complications: No apparent anesthesia complications

## 2018-08-05 NOTE — Anesthesia Preprocedure Evaluation (Addendum)
Anesthesia Evaluation  Patient identified by MRN, date of birth, ID band Patient awake    Reviewed: Allergy & Precautions, NPO status , Patient's Chart, lab work & pertinent test results  Airway Mallampati: II  TM Distance: >3 FB     Dental   Pulmonary neg pulmonary ROS,    breath sounds clear to auscultation       Cardiovascular negative cardio ROS   Rhythm:Regular     Neuro/Psych negative neurological ROS     GI/Hepatic Neg liver ROS, Acute appendicitis   Endo/Other  diabetes, Oral Hypoglycemic Agents  Renal/GU      Musculoskeletal   Abdominal   Peds  Hematology negative hematology ROS (+)   Anesthesia Other Findings   Reproductive/Obstetrics                            Lab Results  Component Value Date   WBC 25.2 (H) 08/05/2018   HGB 15.2 08/05/2018   HCT 45.5 08/05/2018   MCV 87.7 08/05/2018   PLT 253 08/05/2018   Lab Results  Component Value Date   CREATININE 1.17 08/05/2018   BUN 19 08/05/2018   NA 137 08/05/2018   K 4.1 08/05/2018   CL 103 08/05/2018   CO2 20 (L) 08/05/2018    Anesthesia Physical Anesthesia Plan  ASA: II  Anesthesia Plan: General   Post-op Pain Management:    Induction: Intravenous  PONV Risk Score and Plan: 2 and Dexamethasone, Ondansetron and Treatment may vary due to age or medical condition  Airway Management Planned: Oral ETT  Additional Equipment:   Intra-op Plan:   Post-operative Plan: Extubation in OR  Informed Consent: I have reviewed the patients History and Physical, chart, labs and discussed the procedure including the risks, benefits and alternatives for the proposed anesthesia with the patient or authorized representative who has indicated his/her understanding and acceptance.     Dental advisory given  Plan Discussed with: CRNA  Anesthesia Plan Comments:         Anesthesia Quick Evaluation

## 2018-08-05 NOTE — Discharge Instructions (Signed)
[facility]  Please arrive at least 30 min before your appointment to complete your check in paperwork.  If you are unable to arrive 30 min prior to your appointment time we may have to cancel or reschedule you. LAPAROSCOPIC SURGERY: POST OP INSTRUCTIONS Always review your discharge instruction sheet given to you by the facility where your surgery was performed. IF YOU HAVE DISABILITY OR FAMILY LEAVE FORMS, YOU MUST BRING THEM TO THE OFFICE FOR PROCESSING.   DO NOT GIVE THEM TO YOUR DOCTOR.  PAIN CONTROL  1. First take acetaminophen (Tylenol) AND/or ibuprofen (Advil) to control your pain after surgery.  Follow directions on package.  Taking acetaminophen (Tylenol) and/or ibuprofen (Advil) regularly after surgery will help to control your pain and lower the amount of prescription pain medication you may need.  You should not take more than 4,000 mg (4 grams) of acetaminophen (Tylenol) in 24 hours.  You should not take ibuprofen (Advil), aleve, motrin, naprosyn or other NSAIDS if you have a history of stomach ulcers or chronic kidney disease.  2. A prescription for pain medication may be given to you upon discharge.  Take your pain medication as prescribed, if you still have uncontrolled pain after taking acetaminophen (Tylenol) or ibuprofen (Advil). 3. Use ice packs to help control pain. 4. If you need a refill on your pain medication, please contact your pharmacy.  They will contact our office to request authorization. Prescriptions will not be filled after 5pm or on week-ends.  HOME MEDICATIONS 5. Take your usually prescribed medications unless otherwise directed.  DIET 6. You should follow a light diet the first few days after arrival home.  Be sure to include lots of fluids daily. Avoid fatty, fried foods.   CONSTIPATION 7. It is common to experience some constipation after surgery and if you are taking pain medication.  Increasing fluid intake and taking a stool  softener (such as Colace) will usually help or prevent this problem from occurring.  A mild laxative (Milk of Magnesia or Miralax) should be taken according to package instructions if there are no bowel movements after 48 hours.  WOUND/INCISION CARE 8. Most patients will experience some swelling and bruising in the area of the incisions.  Ice packs will help.  Swelling and bruising can take several days to resolve.  9. Unless discharge instructions indicate otherwise, follow guidelines below  a. STERI-STRIPS - you may remove your outer bandages 48 hours after surgery, and you may shower at that time.  You have steri-strips (small skin tapes) in place directly over the incision.  These strips should be left on the skin for 7-10 days.   b. DERMABOND/SKIN GLUE - you may shower in 24 hours.  The glue will flake off over the next 2-3 weeks. 10. Any sutures or staples will be removed at the office during your follow-up visit.  ACTIVITIES 11. You may resume regular (light) daily activities beginning the next day--such as daily self-care, walking, climbing stairs--gradually increasing activities as tolerated.  You may have sexual intercourse when it is comfortable.  Refrain from any heavy lifting or straining until approved by your doctor. a. You may drive when you are no longer taking prescription pain medication, you can comfortably wear a seatbelt, and you can safely maneuver your car and apply brakes.  FOLLOW-UP 12. You should see your doctor in the office for a follow-up appointment approximately 2-3 weeks after your surgery.  You should have been given your post-op/follow-up appointment when   your surgery was scheduled.  If you did not receive a post-op/follow-up appointment, make sure that you call for this appointment within a day or two after you arrive home to insure a convenient appointment time.   WHEN TO CALL YOUR DOCTOR: 1. Fever over 101.0 2. Inability to urinate 3. Continued bleeding from  incision. 4. Increased pain, redness, or drainage from the incision. 5. Increasing abdominal pain  The clinic staff is available to answer your questions during regular business hours.  Please don't hesitate to call and ask to speak to one of the nurses for clinical concerns.  If you have a medical emergency, go to the nearest emergency room or call 911.  A surgeon from [facility] is always on call at the hospital. [address], [address], [place], [place]  [address] ? [address] [address], [place], [place]   [address] [phone] ? [phone] ? FAX [phone]  .........   Managing Your Pain After Surgery Without Opioids    Thank you for participating in our program to help patients manage their pain after surgery without opioids. This is part of our effort to provide you with the best care possible, without exposing you or your family to the risk that opioids pose.  What pain can I expect after surgery? You can expect to have some pain after surgery. This is normal. The pain is typically worse the day after surgery, and quickly begins to get better. Many studies have found that many patients are able to manage their pain after surgery with Over-the-Counter (OTC) medications such as Tylenol and Motrin. If you have a condition that does not allow you to take Tylenol or Motrin, notify your surgical team.  How will I manage my pain? The best strategy for controlling your pain after surgery is around the clock pain control with Tylenol (acetaminophen) and Motrin (ibuprofen or Advil). Alternating these medications with each other allows you to maximize your pain control. In addition to Tylenol and Motrin, you can use heating pads or ice packs on your incisions to help reduce your pain.  How will I alternate your regular strength over-the-counter pain medication? You will take a dose of pain medication every three hours. ; Start by taking 650 mg of Tylenol (2 pills of 325  mg) ; 3 hours later take 600 mg of Motrin (3 pills of 200 mg) ; 3 hours after taking the Motrin take 650 mg of Tylenol ; 3 hours after that take 600 mg of Motrin.   - 1 -  See example - if your first dose of Tylenol is at 12:00 PM   12:00 PM Tylenol 650 mg (2 pills of 325 mg)  3:00 PM Motrin 600 mg (3 pills of 200 mg)  6:00 PM Tylenol 650 mg (2 pills of 325 mg)  9:00 PM Motrin 600 mg (3 pills of 200 mg)  Continue alternating every 3 hours   We recommend that you follow this schedule around-the-clock for at least 3 days after surgery, or until you feel that it is no longer needed. Use the table on the last page of this handout to keep track of the medications you are taking. Important: Do not take more than 3000mg of Tylenol or 3200mg of Motrin in a 24-hour period. Do not take ibuprofen/Motrin if you have a history of bleeding stomach ulcers, severe kidney disease, &/or actively taking a blood thinner  What if I still have pain? If you have pain that is not   controlled with the over-the-counter pain medications (Tylenol and Motrin or Advil) you might have what we call "breakthrough" pain. You will receive a prescription for a small amount of an opioid pain medication such as Oxycodone, Tramadol, or Tylenol with Codeine. Use these opioid pills in the first 24 hours after surgery if you have breakthrough pain. Do not take more than 1 pill every 4-6 hours.  If you still have uncontrolled pain after using all opioid pills, don't hesitate to call our staff using the number provided. We will help make sure you are managing your pain in the best way possible, and if necessary, we can provide a prescription for additional pain medication.   Day 1    Time  Name of Medication Number of pills taken  Amount of Acetaminophen  Pain Level   Comments  AM PM       AM PM       AM PM       AM PM       AM PM       AM PM       AM PM       AM PM       Total Daily amount of Acetaminophen Do not  take more than  3,000 mg per day      Day 2    Time  Name of Medication Number of pills taken  Amount of Acetaminophen  Pain Level   Comments  AM PM       AM PM       AM PM       AM PM       AM PM       AM PM       AM PM       AM PM       Total Daily amount of Acetaminophen Do not take more than  3,000 mg per day      Day 3    Time  Name of Medication Number of pills taken  Amount of Acetaminophen  Pain Level   Comments  AM PM       AM PM       AM PM       AM PM          AM PM       AM PM       AM PM       AM PM       Total Daily amount of Acetaminophen Do not take more than  3,000 mg per day      Day 4    Time  Name of Medication Number of pills taken  Amount of Acetaminophen  Pain Level   Comments  AM PM       AM PM       AM PM       AM PM       AM PM       AM PM       AM PM       AM PM       Total Daily amount of Acetaminophen Do not take more than  3,000 mg per day      Day 5    Time  Name of Medication Number of pills taken  Amount of Acetaminophen  Pain Level   Comments  AM PM       AM PM       AM   PM       AM PM       AM PM       AM PM       AM PM       AM PM       Total Daily amount of Acetaminophen Do not take more than  3,000 mg per day       Day 6    Time  Name of Medication Number of pills taken  Amount of Acetaminophen  Pain Level  Comments  AM PM       AM PM       AM PM       AM PM       AM PM       AM PM       AM PM       AM PM       Total Daily amount of Acetaminophen Do not take more than  3,000 mg per day      Day 7    Time  Name of Medication Number of pills taken  Amount of Acetaminophen  Pain Level   Comments  AM PM       AM PM       AM PM       AM PM       AM PM       AM PM       AM PM       AM PM       Total Daily amount of Acetaminophen Do not take more than  3,000 mg per day        For additional information about how and where to safely dispose of unused  opioid medications - https://www.morepowerfulnc.org  Disclaimer: This document contains information and/or instructional materials adapted from [facility] for the typical patient with your condition. It does not replace medical advice from your health care provider because your experience may differ from that of the typical patient. Talk to your health care provider if you have any questions about this document, your condition or your treatment plan. Adapted from [facility]   

## 2018-08-05 NOTE — ED Triage Notes (Signed)
Pt sent here after outpatient ct showed appendicitis.  Denies nausea,  But states RLQ pain 4/10.

## 2018-08-05 NOTE — H&P (Signed)
[facility] Consult/Admission Note  Casey Figueroa 20-Jul-1965  [id number].    Requesting MD: Dr. Donnald Garre Chief Complaint/Reason for Consult: appendicitis  HPI:   Pt is a 53 yo male with a hx of DMII who presented to the ED under advice of his PCP because his CT abd/pel yesterday showed early appendicitis. 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 He thought he pulled a muscle. The pain got worse so he called his cousin who is a provider who sent him for a CT scan. She told him the results this am and told him to come to the ER. He states the pain has not increased and its between a 4-5/10. The pain is constant and non radiating. He denies nausea, vomiting, fever, chills, diarrhea, melena, CP, SOB, cough or recent COVID 19 + contacts. He has no hx of abdominal surgery and he is not on anticoagulation. He had a bottle of water this morning otherwise no food or drink since. He does not smoke.   ROS:  Review of Systems  Constitutional: Negative for chills, diaphoresis and fever.  HENT: Negative for sore throat.   Respiratory: Negative for cough and shortness of breath.   Cardiovascular: Negative for chest pain.  Gastrointestinal: Positive for abdominal pain. Negative for blood in stool, constipation, diarrhea, nausea and vomiting.  Genitourinary: Negative for dysuria.  Skin: Negative for rash.  Neurological: Negative for dizziness and loss of consciousness.  All other systems reviewed and are negative.    No family history on file.  Past Medical History:  Diagnosis Date  . Diabetes mellitus without complication (HCC)     History reviewed. No pertinent surgical history.  Social History:  reports that he has never smoked. He has never used smokeless tobacco. He reports previous alcohol use. He reports previous drug use.  Allergies: No Known Allergies  (Not in a hospital admission)   Blood pressure 130/77, pulse (!) 105, temperature  98.2 F (36.8 C), resp. rate (!) 8, height 6\' 4"  (1.93 m), weight 106.6 kg, SpO2 (!) 85 %.  Physical Exam Vitals signs and nursing note reviewed.  Constitutional:      General: He is not in acute distress.    Appearance: Normal appearance. He is not diaphoretic.  HENT:     Head: Normocephalic and atraumatic.     Nose: Nose normal.     Mouth/Throat:     Lips: Pink.     Mouth: Mucous membranes are moist.     Pharynx: Oropharynx is clear.  Eyes:     General: No scleral icterus.       Right eye: No discharge.        Left eye: No discharge.     Conjunctiva/sclera: Conjunctivae normal.     Pupils: Pupils are equal, round, and reactive to light.  Neck:     Musculoskeletal: Normal range of motion and neck supple.  Cardiovascular:     Rate and Rhythm: Normal rate and regular rhythm.     Pulses:          Dorsalis pedis pulses are 2+ on the right side and 2+ on the left side.     Heart sounds: Normal heart sounds. No murmur.  Pulmonary:     Effort: Pulmonary effort is normal. No respiratory distress.     Breath sounds: Normal breath sounds. No wheezing, rhonchi or rales.  Abdominal:     General: Bowel sounds are normal. There is no  distension.     Palpations: Abdomen is soft. Abdomen is not rigid. There is no hepatomegaly or splenomegaly.     Tenderness: There is abdominal tenderness in the right lower quadrant. There is guarding. Positive signs include McBurney's sign.     Hernia: No hernia is present.  Musculoskeletal: Normal range of motion.        General: No tenderness or deformity.  Skin:    General: Skin is warm and dry.     Findings: No rash.  Neurological:     Mental Status: He is alert and oriented to person, place, and time.  Psychiatric:        Mood and Affect: Mood normal.        Behavior: Behavior normal.     Results for orders placed or performed during the hospital encounter of 08/05/18 (from the past 48 hour(s))  Comprehensive metabolic panel     Status:  Abnormal   Collection Time: 08/05/18 11:55 AM  Result Value Ref Range   Sodium 137 135 - 145 mmol/L   Potassium 4.1 3.5 - 5.1 mmol/L   Chloride 103 98 - 111 mmol/L   CO2 20 (L) 22 - 32 mmol/L   Glucose, Bld 168 (H) 70 - 99 mg/dL   BUN 19 6 - 20 mg/dL   Creatinine, Ser 8.98 0.61 - 1.24 mg/dL   Calcium 9.2 8.9 - 42.1 mg/dL   Total Protein 7.9 6.5 - 8.1 g/dL   Albumin 3.9 3.5 - 5.0 g/dL   AST 21 15 - 41 U/L   ALT 21 0 - 44 U/L   Alkaline Phosphatase 64 38 - 126 U/L   Total Bilirubin 1.7 (H) 0.3 - 1.2 mg/dL   GFR calc non Af Amer >60 >60 mL/min   GFR calc Af Amer >60 >60 mL/min   Anion gap 14 5 - 15    Comment: Performed at [facility], [address] [place], [place] [address]  Lipase, blood     Status: None   Collection Time: 08/05/18 11:55 AM  Result Value Ref Range   Lipase 26 11 - 51 U/L    Comment: Performed at [facility], [address] [place], [place] [address]  Lactic acid, plasma     Status: None   Collection Time: 08/05/18 11:55 AM  Result Value Ref Range   Lactic Acid, Venous 1.4 0.5 - 1.9 mmol/L    Comment: Performed at [facility], [address], [place], [place] [address]  CBC with Differential     Status: Abnormal (Preliminary result)   Collection Time: 08/05/18 11:55 AM  Result Value Ref Range   WBC 25.2 (H) 4.0 - 10.5 K/uL   RBC 5.19 4.22 - 5.81 MIL/uL   Hemoglobin 15.2 13.0 - 17.0 g/dL   HCT 66.8 15.9 - 47.0 %   MCV 87.7 80.0 - 100.0 fL   MCH 29.3 26.0 - 34.0 pg   MCHC 33.4 30.0 - 36.0 g/dL   RDW 76.1 51.8 - 34.3 %   Platelets 253 150 - 400 K/uL   nRBC 0.0 0.0 - 0.2 %    Comment: Performed at [facility], [address], [place], [place] [address]   Neutrophils Relative % PENDING %   Neutro Abs PENDING 1.7 - 7.7 K/uL   Band Neutrophils PENDING %   Lymphocytes Relative PENDING %   Lymphs Abs PENDING 0.7 - 4.0 K/uL   Monocytes Relative PENDING %   Monocytes Absolute  PENDING 0.1 - 1.0 K/uL   Eosinophils Relative PENDING  %   Eosinophils Absolute PENDING 0.0 - 0.5 K/uL   Basophils Relative PENDING %   Basophils Absolute PENDING 0.0 - 0.1 K/uL   WBC Morphology PENDING    RBC Morphology PENDING    Smear Review PENDING    Other PENDING %   nRBC PENDING 0 /100 WBC   Metamyelocytes Relative PENDING %   Myelocytes PENDING %   Promyelocytes Relative PENDING %   Blasts PENDING %  Protime-INR     Status: None   Collection Time: 08/05/18 11:55 AM  Result Value Ref Range   Prothrombin Time 14.0 11.4 - 15.2 seconds   INR 1.1 0.8 - 1.2    Comment: (NOTE) INR goal varies based on device and disease states. Performed at [facility], [address], [place], [place] [address]   Urinalysis, Routine w reflex microscopic     Status: Abnormal   Collection Time: 08/05/18 12:20 PM  Result Value Ref Range   Color, Urine YELLOW YELLOW   APPearance CLEAR CLEAR   Specific Gravity, Urine 1.032 (H) 1.005 - 1.030   pH 5.0 5.0 - 8.0   Glucose, UA >=500 (A) NEGATIVE mg/dL   Hgb urine dipstick SMALL (A) NEGATIVE   Bilirubin Urine NEGATIVE NEGATIVE   Ketones, ur 20 (A) NEGATIVE mg/dL   Protein, ur NEGATIVE NEGATIVE mg/dL   Nitrite NEGATIVE NEGATIVE   Leukocytes,Ua NEGATIVE NEGATIVE   RBC / HPF 0-5 0 - 5 RBC/hpf   WBC, UA 0-5 0 - 5 WBC/hpf   Bacteria, UA RARE (A) NONE SEEN   Mucus PRESENT     Comment: Performed at [facility], [address], [place], [place] [address]   No results found.    Assessment/Plan Active Problems:   Appendicitis  Type II Dm - SSI  Appendicitis - Zosyn, NPO, OR today for lap appy  FEN: NPO VTE: SCD's, lovenox will start POD#1 ID: [email or web address] Foley: none Follow up: TBD  Plan: OR today for lap appy. Admit to [facility] service.    Jerre Simon, [facility] 08/05/2018, 12:52 PM Pager: [phone] Consults: [phone] Mon-Fri 7:00 am-4:30 pm Sat-Sun 7:00 am-11:30 am

## 2018-08-05 NOTE — Op Note (Signed)
ALDUS LOPERA [id number]   PRE-OPERATIVE DIAGNOSIS:  Acute appendicitis  POST-OPERATIVE DIAGNOSIS:  Acute gangrenous appendicitis  Procedure(s): APPENDECTOMY LAPAROSCOPIC  PROCEDURE: Laparoscopic appendectomy  SURGEON:  Stephanie Coup. Leanard Dimaio, M.D.  ASSISTANT: Leary Roca, PA-C  ANESTHESIA: General endotracheal  EBL:   5 mL  DRAINS: None  SPECIMEN:  Appendix  COUNTS:  Sponge, needle and instrument counts were reported correct x2 at conclusion of the operation  DISPOSITION:  PACU in satisfactory condition  COMPLICATIONS: None  FINDINGS: Gangrenous appendicitis - no frank perforation in abdomen but wall was quite thin. The base was viable but to be sure a small cuff of normal appearing cecum was taken with it.  INDICATIONS: Mr. Casey Figueroa is a very pleasant 52yoM with hx of DM whom presented to the ED with acute appendicitis - diagnosed with acute appendicitis yesterday with CT scan but didn't find out until today from his PCP. He reported to the ED under their advisement. He was evaluated and options discussed moving forward. He opted to pursue surgery to remove his appendix. Please refer to notes elsewhere for details regarding this discussion.  DESCRIPTION:   The patient was identified & brought into the operating room. SCDs were in place and functioning. General endotracheal anesthesia was administered. Preoperative antibiotics were administered. The patient was positioned supine with left arm tucked. Hair on the abdomen was then clipped by the OR team. A foley catheter was inserted under sterile conditions. The abdomen was prepped and draped in the standard sterile fashion. A surgical timeout confirmed our plan.  A small incision was made in the infraumbilical location. The subcutaneous tissue was dissected and the umbilical stalk identified. The stalk was grasped with a Kocher and retracted outwardly. The infraumbilical fascia was exposed and incised. Peritoneal entry was carefully  made bluntly. A 0 Vicryl purse-string suture was placed and then the [facility] port was introduced into the abdomen.  CO2 insufflation commenced to . The laparoscope was inserted and confirmed no evidence of trocar site complications. The patient was then positioned in Trendelenburg. Two additional ports were placed - one in left lower quadrant and another in the suprapubic midline taking care to stay well above the bladder - 3 fingerbreadths above the pubic symphysis. The bed was then slightly tilted to place the left side and head down.  The terminal ileum was identified. This was gently mobilized sharply up to the cecum in a lateral to medial fashion. Care was taken to avoid injuring any retroperitoneal structures. The appendix was identified. It was gangrenous but not frankly perforated. Attachments of the appendix to the surrounding tissues were freed without difficulty.  The appendix was elevated.  The base of the appendix was circumferentially dissected taking care to preserve the cecum free of injury. The base was noted to be viable and healthy appearing. The terminal ileum, cecum and ascending colon also appeared normal. The base of the appendix was then stapled with a blue load, taking a small healthy cuff of viable cecum, taking care to stay clear of the ileocecal valve. The mesoappendix was then ligated using a Maylie Ashton load of the 45 mm laparoscopic stapler. The mesoappendix was inspected and noted to be hemostatic. The appendix was placed in an EndoBag.  The right lower quadrant was conservatively irrigated. Hemostasis was noted to be achieved - taking time to inspect the ligated mesoappendix, colon mesentery, and retroperitoneum. The staple line on the cecum was noted to be intact and without bleeding. There was no perforation or injury. There was  some fibrinous exudate in the right lower quadrant but did appear relatively clean and as such, no drain was placed.  The left lower quadrant and  suprapubic ports were removed under direct visualization. The EndoBag was then removed through the umbilical port site and passed off as specimen. The CO2 was exhausted from the abdomen. The umbilical fascia was then closed by closing the 0 Vicryl suture. The fascia was palpated and noted to be completely closed. The skin of all port sites was then approximated using 4-0 Monocryl suture. The incisions were covered with Dermabond.  He was then awakened from general anesthesia, extubated, and transferred to a stretcher for transport to recover in satisfactory condition. As per his request, I updated his girlfriend Arline Asp about the case.

## 2018-08-05 NOTE — Anesthesia Procedure Notes (Signed)
Procedure Name: Intubation Date/Time: 08/05/2018 3:05 PM Performed by: Barrington Ellison, CRNA Pre-anesthesia Checklist: Patient identified, Emergency Drugs available, Suction available and Patient being monitored Patient Re-evaluated:Patient Re-evaluated prior to induction Oxygen Delivery Method: Circle System Utilized Preoxygenation: Pre-oxygenation with 100% oxygen Induction Type: IV induction Ventilation: Mask ventilation without difficulty Laryngoscope Size: Mac and 4 Grade View: Grade I Tube type: Oral Tube size: 7.5 mm Number of attempts: 1 Airway Equipment and Method: Stylet and Oral airway Placement Confirmation: ETT inserted through vocal cords under direct vision,  positive ETCO2 and breath sounds checked- equal and bilateral Secured at: 23 cm Tube secured with: Tape Dental Injury: Teeth and Oropharynx as per pre-operative assessment

## 2018-08-05 NOTE — ED Provider Notes (Signed)
[facility] EMERGENCY DEPARTMENT Provider Note   CSN: [id number] Arrival date & time: 08/05/18  1123    History   Chief Complaint Chief Complaint  Patient presents with  . Abdominal Pain    appy    HPI Casey Figueroa is a 53 y.o. male.     HPI Patient reports he started developing some right lower abdominal pain yesterday around lunchtime.  He was seen at [facility] outpatient facility and had a CT scan done yesterday for suspicion of appendicitis.  Patient reports that he had not heard back and called this morning to get results being concern for possible appendicitis.  Results were positive.  Patient is referred to the emergency department.  He reports pain is fairly severe in the right lower quadrant and worse with movements.  He has not had fever or vomiting.  No prior abdominal surgeries.  Patient reports he drank some water at about 6 AM and has not eaten anything else today.  He reports he is diabetic.  He did not take his insulin today because he is not eating.  He denies any use of alcohol, non-smoker, no drug use. Past Medical History:  Diagnosis Date  . Diabetes mellitus without complication [facility])     Patient Active Problem List   Diagnosis Date Noted  . Right foot infection 12/15/2017    No past surgical history on file.      Home Medications    Prior to Admission medications   Medication Sig Start Date End Date Taking? Authorizing Provider  atorvastatin (LIPITOR) 10 MG tablet Take 1 tablet by mouth daily. 12/14/17   [provider]  JARDIANCE 25 MG TABS tablet Take 1 tablet by mouth daily. 12/02/17   [provider]  lisinopril (PRINIVIL,ZESTRIL) 5 MG tablet Take 1 tablet by mouth daily. 12/13/17   [provider]  metFORMIN (GLUCOPHAGE) 1000 MG tablet Take 1 tablet by mouth 2 (two) times daily. 12/13/17   [provider]  TRADJENTA 5 MG TABS tablet Take 5 mg by mouth daily. 09/29/17   [provider]     Family History No family history on file.  Social History Social History   Tobacco Use  . Smoking status: Never Smoker  . Smokeless tobacco: Never Used  Substance Use Topics  . Alcohol use: Not Currently    Frequency: Never  . Drug use: Not on file     Allergies   Patient has no known allergies.   Review of Systems Review of Systems 10 Systems reviewed and are negative for acute change except as noted in the HPI.   Physical Exam Updated Vital Signs There were no vitals taken for this visit.  Physical Exam Constitutional:      Appearance: Normal appearance.  HENT:     Nose: Nose normal.     Mouth/Throat:     Mouth: Mucous membranes are moist.     Pharynx: Oropharynx is clear.  Eyes:     Extraocular Movements: Extraocular movements intact.  Cardiovascular:     Rate and Rhythm: Normal rate and regular rhythm.  Pulmonary:     Effort: Pulmonary effort is normal.     Breath sounds: Normal breath sounds.  Abdominal:     Palpations: Abdomen is soft.     Tenderness: There is abdominal tenderness. There is guarding.     Comments: Right lower quadrant guarding.  Left lower quadrant soft without guarding.  Positive rebound pain.  Musculoskeletal: Normal range of motion.  General: No swelling.  Skin:    General: Skin is warm and dry.  Neurological:     General: No focal deficit present.     Mental Status: He is alert and oriented to person, place, and time.     Coordination: Coordination normal.  Psychiatric:        Mood and Affect: Mood normal.      ED Treatments / Results  Labs (all labs ordered are listed, but only abnormal results are displayed) Labs Reviewed  COMPREHENSIVE METABOLIC PANEL  LIPASE, BLOOD  LACTIC ACID, PLASMA  LACTIC ACID, PLASMA  CBC WITH DIFFERENTIAL/PLATELET  PROTIME-INR  URINALYSIS, ROUTINE W REFLEX MICROSCOPIC    EKG None  Radiology No results found.  Procedures Procedures (including critical care time)   Medications Ordered in ED Medications  lactated ringers bolus 1,000 mL (has no administration in time range)  piperacillin-tazobactam (ZOSYN) IVPB 3.375 g (has no administration in time range)  HYDROmorphone (DILAUDID) injection 1 mg (has no administration in time range)     Initial Impression / Assessment and Plan / ED Course  I have reviewed the triage vital signs and the nursing notes.  Pertinent labs & imaging results that were available during my care of the patient were reviewed by me and considered in my medical decision making (see chart for details).  Clinical Course as of Aug 06 825  Fri Aug 05, 2018  1215 Consult: PA-C Leary Roca has returned call to proceed with evaluation and management for appendicitis.   [MP]    Clinical Course User Index [MP] Arby Barrette, MD      Patient arrives to the emergency department CT confirmatory for appendicitis.  This was done yesterday evening.  Patient has very tender right lower quadrant.  Started on Zosyn and surgery consulted for definitive management.  Final Clinical Impressions(s) / ED Diagnoses   Final diagnoses:  Acute appendicitis, unspecified acute appendicitis type    ED Discharge Orders    None       Arby Barrette, MD 08/07/18 [phone]

## 2018-08-05 NOTE — ED Notes (Signed)
PT with episode of nausea when he stood up to take off clothes

## 2018-08-06 LAB — BASIC METABOLIC PANEL
Anion gap: 12 (ref 5–15)
BUN: 21 mg/dL — AB (ref 6–20)
CO2: 21 mmol/L — ABNORMAL LOW (ref 22–32)
Calcium: 8.8 mg/dL — ABNORMAL LOW (ref 8.9–10.3)
Chloride: 103 mmol/L (ref 98–111)
Creatinine, Ser: 1.11 mg/dL (ref 0.61–1.24)
GFR calc Af Amer: >60 mL/min (ref >60)
GFR calc non Af Amer: >60 mL/min (ref >60)
Glucose, Bld: 185 mg/dL — ABNORMAL HIGH (ref 70–99)
Potassium: 4.5 mmol/L (ref 3.5–5.1)
Sodium: 136 mmol/L (ref 135–145)

## 2018-08-06 LAB — CBC
HCT: 40.2 % (ref 39.0–52.0)
Hemoglobin: 13.7 g/dL (ref 13.0–17.0)
MCH: 30.1 pg (ref 26.0–34.0)
MCHC: 34.1 g/dL (ref 30.0–36.0)
MCV: 88.4 fL (ref 80.0–100.0)
Platelets: 234 10*3/uL (ref 150–400)
RBC: 4.55 MIL/uL (ref 4.22–5.81)
RDW: 12.5 % (ref 11.5–15.5)
WBC: 23.9 10*3/uL — ABNORMAL HIGH (ref 4.0–10.5)
nRBC: 0 % (ref 0.0–0.2)

## 2018-08-06 MED ORDER — AMOXICILLIN-POT CLAVULANATE 875-125 MG PO TABS: 1.0000 | ORAL_TABLET | Freq: Two times a day (BID) | ORAL | 0 refills | Status: AC

## 2018-08-06 NOTE — Progress Notes (Signed)
Patient ID: Casey Figueroa, male   DOB: [birth year], 53 y.o.   MRN: [id number]   Doing well Pain controlled Wants to go home  Abdomen soft, appropriately tender  Plan: Discharge home on oral antibiotics

## 2018-08-06 NOTE — Discharge Summary (Signed)
Physician Discharge Summary  Patient ID: Casey Figueroa MRN: [id number] DOB/AGE: December 03, 1965 53 y.o.  Admit date: 08/05/2018 Discharge date: 08/06/2018  Admission Diagnoses:  Discharge Diagnoses:  Active Problems:   Appendicitis   Discharged Condition: good  Hospital Course: uneventful post op recovery s/p lap appy.  Wanted to go home POD#1 given COVID outbreak.  Consults: None  Significant Diagnostic Studies:   Treatments: surgery: laparoscopic appendectomy  Discharge Exam: Blood pressure 128/66, pulse 78, temperature 98 F (36.7 C), temperature source Oral, resp. rate 18, height 6\' 4"  (1.93 m), weight 106.6 kg, SpO2 97 %. General appearance: alert, cooperative and no distress Resp: clear to auscultation bilaterally Cardio: regular rate and rhythm, S1, S2 normal, no murmur, click, rub or gallop Incision/Wound:abdomen soft, incisions clean  Disposition: Discharge disposition: 01-Home or Self Care        Allergies as of 08/06/2018   No Known Allergies     Medication List    TAKE these medications   amoxicillin-clavulanate 875-125 MG tablet Commonly known as:  Augmentin Take 1 tablet by mouth 2 (two) times daily for 7 days.   atorvastatin 10 MG tablet Commonly known as:  LIPITOR Take 10 mg by mouth daily.   Jardiance 25 MG Tabs tablet Generic drug:  empagliflozin Take 25 mg by mouth daily.   lisinopril 5 MG tablet Commonly known as:  PRINIVIL,ZESTRIL Take 5 mg by mouth daily.   metFORMIN 1000 MG tablet Commonly known as:  GLUCOPHAGE Take 1,000 mg by mouth 2 (two) times daily.   Tradjenta 5 MG Tabs tablet Generic drug:  linagliptin Take 5 mg by mouth daily.   vitamin C 500 MG tablet Commonly known as:  ASCORBIC ACID Take 500 mg by mouth daily.      Follow-up Information    [facility] Follow up on 08/05/2018.   Specialty:  General Surgery Why:  08/23/2018 at 845am. Due to the coronavirus this will be a televisit. You will recieve a  call at the time of your appt.  Please email a picture of your incisions along with your name to [email or web address] the morning of your appointment.  Contact information: [address] [place] [address] [phone]           Signed: Abigail Miyamoto 08/06/2018, 8:38 AM

## 2018-08-07 NOTE — Anesthesia Postprocedure Evaluation (Signed)
Anesthesia Post Note  Patient: Casey Figueroa  Procedure(s) Performed: APPENDECTOMY LAPAROSCOPIC (N/A Abdomen)     Patient location during evaluation: PACU Anesthesia Type: General Level of consciousness: awake and alert Pain management: pain level controlled Vital Signs Assessment: post-procedure vital signs reviewed and stable Respiratory status: spontaneous breathing, nonlabored ventilation, respiratory function stable and patient connected to nasal cannula oxygen Cardiovascular status: blood pressure returned to baseline and stable Postop Assessment: no apparent nausea or vomiting Anesthetic complications: no    Last Vitals:  Vitals:   08/06/18 0146 08/06/18 0524  BP: 130/68 128/66  Pulse: 82 78  Resp: 16 18  Temp: 36.7 C 36.7 C  SpO2: 95% 97%    Last Pain:  Vitals:   08/06/18 0912  TempSrc:   PainSc: 0-No pain                 Kennieth Rad

## 2018-08-08 ENCOUNTER — Encounter (HOSPITAL_COMMUNITY): Payer: Self-pay | Admitting: Surgery

## 2018-08-08 LAB — GLUCOSE, CAPILLARY: Glucose-Capillary: 146 mg/dL — ABNORMAL HIGH (ref 70–99)

## 2018-11-30 ENCOUNTER — Emergency Department (HOSPITAL_COMMUNITY)
Admission: EM | Admit: 2018-11-30 | Discharge: 2018-11-30 | Disposition: A | Discharge status: Home / Self Care | Payer: 59 | Attending: Emergency Medicine | Admitting: Emergency Medicine

## 2018-11-30 ENCOUNTER — Encounter (HOSPITAL_COMMUNITY): Payer: Self-pay | Admitting: Emergency Medicine

## 2018-11-30 ENCOUNTER — Emergency Department (HOSPITAL_COMMUNITY): Payer: 59

## 2018-11-30 ENCOUNTER — Other Ambulatory Visit: Payer: Self-pay

## 2018-11-30 DIAGNOSIS — R531 Weakness: Secondary | ICD-10-CM | POA: Insufficient documentation

## 2018-11-30 DIAGNOSIS — E119 Type 2 diabetes mellitus without complications: Secondary | ICD-10-CM | POA: Insufficient documentation

## 2018-11-30 DIAGNOSIS — U071 COVID-19: Secondary | ICD-10-CM | POA: Diagnosis not present

## 2018-11-30 DIAGNOSIS — R05 Cough: Secondary | ICD-10-CM | POA: Insufficient documentation

## 2018-11-30 DIAGNOSIS — R0602 Shortness of breath: Secondary | ICD-10-CM | POA: Diagnosis present

## 2018-11-30 DIAGNOSIS — Z7984 Long term (current) use of oral hypoglycemic drugs: Secondary | ICD-10-CM | POA: Insufficient documentation

## 2018-11-30 MED ORDER — ALBUTEROL SULFATE HFA 108 (90 BASE) MCG/ACT IN AERS
2.0000 | INHALATION_SPRAY | Freq: Once | RESPIRATORY_TRACT | Status: AC
  Administered 2018-11-30: 2 via RESPIRATORY_TRACT
  Filled 2018-11-30: qty 6.7

## 2018-11-30 NOTE — Discharge Instructions (Signed)
You were seen in the ER for trouble breathing.  Your chest x-ray was normal.  Your oxygen saturations were reassuring.  We are sending you home to use an inhaler 1-2 puffs every 4-6 hours as needed for trouble breathing.  Please take tylenol per over the counter dosing to help with fever or pain.   . We are instructing patient's with COVID 19 quarantine themselves for 14 days. You may be able to discontinue self quarantine if the following conditions are met:   Persons with COVID-19 who have symptoms and were directed to care for themselves at home may discontinue home isolation under the  following conditions: - It has been at least 7 days have passed since symptoms first appeared. - AND at least 3 days (72 hours) have passed since recovery defined as resolution of fever without the use of fever-reducing medications and improvement in respiratory symptoms (e.g., cough, shortness of breath)  Please follow the below quarantine instructions.   Please follow up with primary care within 3-5 days for re-evaluation- call prior to going to the office to make them aware of your symptoms. Return to the ER for new or worsening symptoms including but not limited to increased work of breathing, fever, chest pain, passing out, or any other concerns.       Person Under Monitoring Name: Casey Figueroa  Location: [place] [address]   Infection Prevention Recommendations for Individuals Confirmed to have, or Being Evaluated for, 2019 Novel Coronavirus (COVID-19) Infection Who Receive Care at Home  Individuals who are confirmed to have, or are being evaluated for, COVID-19 should follow the prevention steps below until a healthcare provider or local or state health department says they can return to normal activities.  Stay home except to get medical care You should restrict activities outside your home, except for getting medical care. Do not go to work, school, or public areas,  and do not use public transportation or taxis.  Call ahead before visiting your doctor Before your medical appointment, call the healthcare provider and tell them that you have, or are being evaluated for, COVID-19 infection. This will help the healthcare providers office take steps to keep other people from getting infected. Ask your healthcare provider to call the local or state health department.  Monitor your symptoms Seek prompt medical attention if your illness is worsening (e.g., difficulty breathing). Before going to your medical appointment, call the healthcare provider and tell them that you have, or are being evaluated for, COVID-19 infection. Ask your healthcare provider to call the local or state health department.  Wear a facemask You should wear a facemask that covers your nose and mouth when you are in the same room with other people and when you visit a healthcare provider. People who live with or visit you should also wear a facemask while they are in the same room with you.  Separate yourself from other people in your home As much as possible, you should stay in a different room from other people in your home. Also, you should use a separate bathroom, if available.  Avoid sharing household items You should not share dishes, drinking glasses, cups, eating utensils, towels, bedding, or other items with other people in your home. After using these items, you should wash them thoroughly with soap and water.  Cover your coughs and sneezes Cover your mouth and nose with a tissue when you cough or sneeze, or you can cough or sneeze into your sleeve.  Throw used tissues in a lined trash can, and immediately wash your hands with soap and water for at least 20 seconds or use an alcohol-based hand rub.  Wash your [facility] your hands often and thoroughly with soap and water for at least 20 seconds. You can use an alcohol-based hand sanitizer if soap and water are not  available and if your hands are not visibly dirty. Avoid touching your eyes, nose, and mouth with unwashed hands.   Prevention Steps for Caregivers and Household Members of Individuals Confirmed to have, or Being Evaluated for, COVID-19 Infection Being Cared for in the Home  If you live with, or provide care at home for, a person confirmed to have, or being evaluated for, COVID-19 infection please follow these guidelines to prevent infection:  Follow healthcare providers instructions Make sure that you understand and can help the patient follow any healthcare provider instructions for all care.  Provide for the patients basic needs You should help the patient with basic needs in the home and provide support for getting groceries, prescriptions, and other personal needs.  Monitor the patients symptoms If they are getting sicker, call his or her medical provider and tell them that the patient has, or is being evaluated for, COVID-19 infection. This will help the healthcare providers office take steps to keep other people from getting infected. Ask the healthcare provider to call the local or state health department.  Limit the number of people who have contact with the patient If possible, have only one caregiver for the patient. Other household members should stay in another home or place of residence. If this is not possible, they should stay in another room, or be separated from the patient as much as possible. Use a separate bathroom, if available. Restrict visitors who do not have an essential need to be in the home.  Keep older adults, very young children, and other sick people away from the patient Keep older adults, very young children, and those who have compromised immune systems or chronic health conditions away from the patient. This includes people with chronic heart, lung, or kidney conditions, diabetes, and cancer.  Ensure good ventilation Make sure that shared spaces  in the home have good air flow, such as from an air conditioner or an opened window, weather permitting.  Wash your hands often Wash your hands often and thoroughly with soap and water for at least 20 seconds. You can use an alcohol based hand sanitizer if soap and water are not available and if your hands are not visibly dirty. Avoid touching your eyes, nose, and mouth with unwashed hands. Use disposable paper towels to dry your hands. If not available, use dedicated cloth towels and replace them when they become wet.  Wear a facemask and gloves Wear a disposable facemask at all times in the room and gloves when you touch or have contact with the patients blood, body fluids, and/or secretions or excretions, such as sweat, saliva, sputum, nasal mucus, vomit, urine, or feces.  Ensure the mask fits over your nose and mouth tightly, and do not touch it during use. Throw out disposable facemasks and gloves after using them. Do not reuse. Wash your hands immediately after removing your facemask and gloves. If your personal clothing becomes contaminated, carefully remove clothing and launder. Wash your hands after handling contaminated clothing. Place all used disposable facemasks, gloves, and other waste in a lined container before disposing them with other household waste. Remove gloves and wash your  hands immediately after handling these items.  Do not share dishes, glasses, or other household items with the patient Avoid sharing household items. You should not share dishes, drinking glasses, cups, eating utensils, towels, bedding, or other items with a patient who is confirmed to have, or being evaluated for, COVID-19 infection. After the person uses these items, you should wash them thoroughly with soap and water.  Wash laundry thoroughly Immediately remove and wash clothes or bedding that have blood, body fluids, and/or secretions or excretions, such as sweat, saliva, sputum, nasal mucus,  vomit, urine, or feces, on them. Wear gloves when handling laundry from the patient. Read and follow directions on labels of laundry or clothing items and detergent. In general, wash and dry with the warmest temperatures recommended on the label.  Clean all areas the individual has used often Clean all touchable surfaces, such as counters, tabletops, doorknobs, bathroom fixtures, toilets, phones, keyboards, tablets, and bedside tables, every day. Also, clean any surfaces that may have blood, body fluids, and/or secretions or excretions on them. Wear gloves when cleaning surfaces the patient has come in contact with. Use a diluted bleach solution (e.g., dilute bleach with 1 part bleach and 10 parts water) or a household disinfectant with a label that says EPA-registered for coronaviruses. To make a bleach solution at home, add 1 tablespoon of bleach to 1 quart (4 cups) of water. For a larger supply, add  cup of bleach to 1 gallon (16 cups) of water. Read labels of cleaning products and follow recommendations provided on product labels. Labels contain instructions for safe and effective use of the cleaning product including precautions you should take when applying the product, such as wearing gloves or eye protection and making sure you have good ventilation during use of the product. Remove gloves and wash hands immediately after cleaning.  Monitor yourself for signs and symptoms of illness Caregivers and household members are considered close contacts, should monitor their health, and will be asked to limit movement outside of the home to the extent possible. Follow the monitoring steps for close contacts listed on the symptom monitoring form.   ? If you have additional questions, contact your local health department or call the epidemiologist on call at [phone] (available 24/7). ? This guidance is subject to change. For the most up-to-date guidance from [facility], please refer to their  website: YouBlogs.pl

## 2018-11-30 NOTE — ED Provider Notes (Signed)
[facility] Provider Note   CSN: [id number] Arrival date & time: 11/30/18  0253     History   Chief Complaint Chief Complaint  Patient presents with  . Shortness of Breath    HPI Casey Figueroa is a [age] y.o. male with a hx of T2DM who presents to the ED w/ complaints of generally not feeling well & dyspnea today. Patient states he has had chills, fatigue, congestion, & productive cough of phlegm sputum for the past several days which prompted him to get tested for COVID 19- results returned positive. He states that he has felt worse over the past 24 hours, reports dyspnea & feeling more fatigued. No alleviating/aggravating factors. No intervention PTA. Denies fever, syncope, chest pain, abdominal pain, N/V/D, leg pain/swelling, hemoptysis, recent surgery/trauma, recent long travel, hormone use, personal hx of cancer, or hx of DVT/PE.   HPI  Past Medical History:  Diagnosis Date  . Diabetes mellitus without complication [facility](HCC)     Patient Active Problem List   Diagnosis Date Noted  . Appendicitis 08/05/2018  . Right foot infection 12/15/2017    Past Surgical History:  Procedure Laterality Date  . LAPAROSCOPIC APPENDECTOMY N/A 08/05/2018   Procedure: APPENDECTOMY LAPAROSCOPIC;  Surgeon: Andria MeuseWhite, Christopher M, MD;  Location: [facility] OR;  Service: General;  Laterality: N/A;        Home Medications    Prior to Admission medications   Medication Sig Start Date End Date Taking? Authorizing Provider  atorvastatin (LIPITOR) 10 MG tablet Take 10 mg by mouth daily.  12/14/17   [provider]  JARDIANCE 25 MG TABS tablet Take 25 mg by mouth daily.  12/02/17   [provider]  lisinopril (PRINIVIL,ZESTRIL) 5 MG tablet Take 5 mg by mouth daily.  12/13/17   [provider]  metFORMIN (GLUCOPHAGE) 1000 MG tablet Take 1,000 mg by mouth 2 (two) times daily.  12/13/17   [provider]  TRADJENTA 5 MG TABS tablet Take 5 mg by mouth  daily. 09/29/17   [provider]  vitamin C (ASCORBIC ACID) 500 MG tablet Take 500 mg by mouth daily.    [provider]    Family History No family history on file.  Social History Social History   Tobacco Use  . Smoking status: Never Smoker  . Smokeless tobacco: Never Used  Substance Use Topics  . Alcohol use: Not Currently    Frequency: Never  . Drug use: Not Currently     Allergies   Patient has no known allergies.   Review of Systems Review of Systems  Constitutional: Negative for chills and fever.  HENT: Positive for congestion. Negative for ear pain and sore throat.   Respiratory: Positive for cough and shortness of breath. Negative for wheezing.   Cardiovascular: Negative for chest pain and palpitations.  Gastrointestinal: Negative for abdominal pain, blood in stool, diarrhea, nausea and vomiting.  Neurological: Positive for weakness (generalized). Negative for dizziness and syncope.  All other systems reviewed and are negative.    Physical Exam Updated Vital Signs BP 139/85 (BP Location: Right Arm)   Pulse 97   Temp 98.9 F (37.2 C) (Oral)   Resp 16   SpO2 95%   Physical Exam Vitals signs and nursing note reviewed.  Constitutional:      General: He is not in acute distress.    Appearance: He is well-developed. He is not toxic-appearing.  HENT:     Head: Normocephalic and atraumatic.  Nose: Congestion present.     Right Sinus: No maxillary sinus tenderness or frontal sinus tenderness.     Left Sinus: No maxillary sinus tenderness or frontal sinus tenderness.     Mouth/Throat:     Mouth: Mucous membranes are moist.     Pharynx: Oropharynx is clear. Uvula midline. No oropharyngeal exudate or posterior oropharyngeal erythema.  Eyes:     General:        Right eye: No discharge.        Left eye: No discharge.     Conjunctiva/sclera: Conjunctivae normal.  Neck:     Musculoskeletal: Normal range of motion and neck supple. No edema,  neck rigidity or crepitus.  Cardiovascular:     Rate and Rhythm: Normal rate and regular rhythm.  Pulmonary:     Effort: Pulmonary effort is normal. No respiratory distress.     Breath sounds: Normal breath sounds. No wheezing, rhonchi or rales.     Comments: SpO2 98-100% on RA @ rest, >96% ambulatory SpO2.  Abdominal:     General: There is no distension.     Palpations: Abdomen is soft.     Tenderness: There is no abdominal tenderness.  Musculoskeletal:     Right lower leg: No edema.     Left lower leg: No edema.  Lymphadenopathy:     Cervical: No cervical adenopathy.  Skin:    General: Skin is warm and dry.     Findings: No rash.  Neurological:     Mental Status: He is alert.     Comments: Clear speech.   Psychiatric:        Behavior: Behavior normal.    ED Treatments / Results  Labs (all labs ordered are listed, but only abnormal results are displayed) Labs Reviewed - No data to display  EKG None  Radiology Dg Chest Portable 1 View  Result Date: 11/30/2018 CLINICAL DATA:  Cough.  COVID-19 EXAM: PORTABLE CHEST 1 VIEW COMPARISON:  None. FINDINGS: Normal heart size and mediastinal contours. No acute infiltrate or edema. No effusion or pneumothorax. No acute osseous findings. IMPRESSION: Negative chest. Electronically Signed   By: Monte Fantasia M.D.   On: 11/30/2018 04:20    Procedures Procedures (including critical care time)  Medications Ordered in ED Medications  albuterol (VENTOLIN HFA) 108 (90 Base) MCG/ACT inhaler 2 puff (2 puffs Inhalation Given 11/30/18 0427)     Initial Impression / Assessment and Plan / ED Course  I have reviewed the triage vital signs and the nursing notes.  Pertinent labs & imaging results that were available during my care of the patient were reviewed by me and considered in my medical decision making (see chart for details).   Patient recently diagnosed w/ covid 19 presents to the ED for dyspnea & generally not feeling well.  Nontoxic appearing, no apparent distress, vitals WNL. Exam overall fairly benign. No meningismus. No sinus tenderness to suggest bacterial sinusitis. Lungs CTA- no wheezing, but trial of inhaler to assist w/ sxs. Plan for CXR.   CXR negative- no infiltrates @ this time. Additionally no fluid overload or PTX.  I personally ambulated patient: ambulatory w/ SpO2 >96%, he does not appear to be in respiratory distress, doubt PE, does not appear to require admission for hypoxia/tachypnea related to covid19. Will discharge home with inhaler for symptomatic relief PRN, will not provide decadron given hx of DM, additionally no prior lung disease of asthma/COPD. Recommended tylenol OTC for chills/fevers/pain. Quarantine precautions. PCP follow up (likely via video  visit/telephone visit) & strict return precautions. I discussed results, treatment plan, need for follow-up, and return precautions with the patient. Provided opportunity for questions, patient confirmed understanding and is in agreement with plan.    Casey Figueroa was evaluated in Emergency Department on 11/30/2018 for the symptoms described in the history of present illness. He/she was evaluated in the context of the global COVID-19 pandemic, which necessitated consideration that the patient might be at risk for infection with the SARS-CoV-2 virus that causes COVID-19. Institutional protocols and algorithms that pertain to the evaluation of patients at risk for COVID-19 are in a state of rapid change based on information released by regulatory bodies including the CDC and federal and state organizations. These policies and algorithms were followed during the patient's care in the ED.  Findings and plan of care discussed with supervising physician Dr. Bebe ShaggyWickline who is in agreement.   Final Clinical Impressions(s) / ED Diagnoses   Final diagnoses:  COVID-19    ED Discharge Orders    None       Cherly Andersonetrucelli, Umaima Scholten R, PA-C 11/30/18 0437     Zadie RhineWickline, Donald, MD 11/30/18 [phone]

## 2018-11-30 NOTE — ED Triage Notes (Signed)
Pt reports he tested + for COVID. States that he doesn't feel like he is breathing right. NAD noted, SpO2 95% in triage.

## 2019-09-20 IMAGING — DX DG FOOT COMPLETE 3+V*R*
3 series · 3 of 3 positions shown · non-contrast
Comparison: None.

CLINICAL DATA: Osteomyelitis.

EXAM:
RIGHT FOOT COMPLETE - 3+ VIEW

[foot ap]
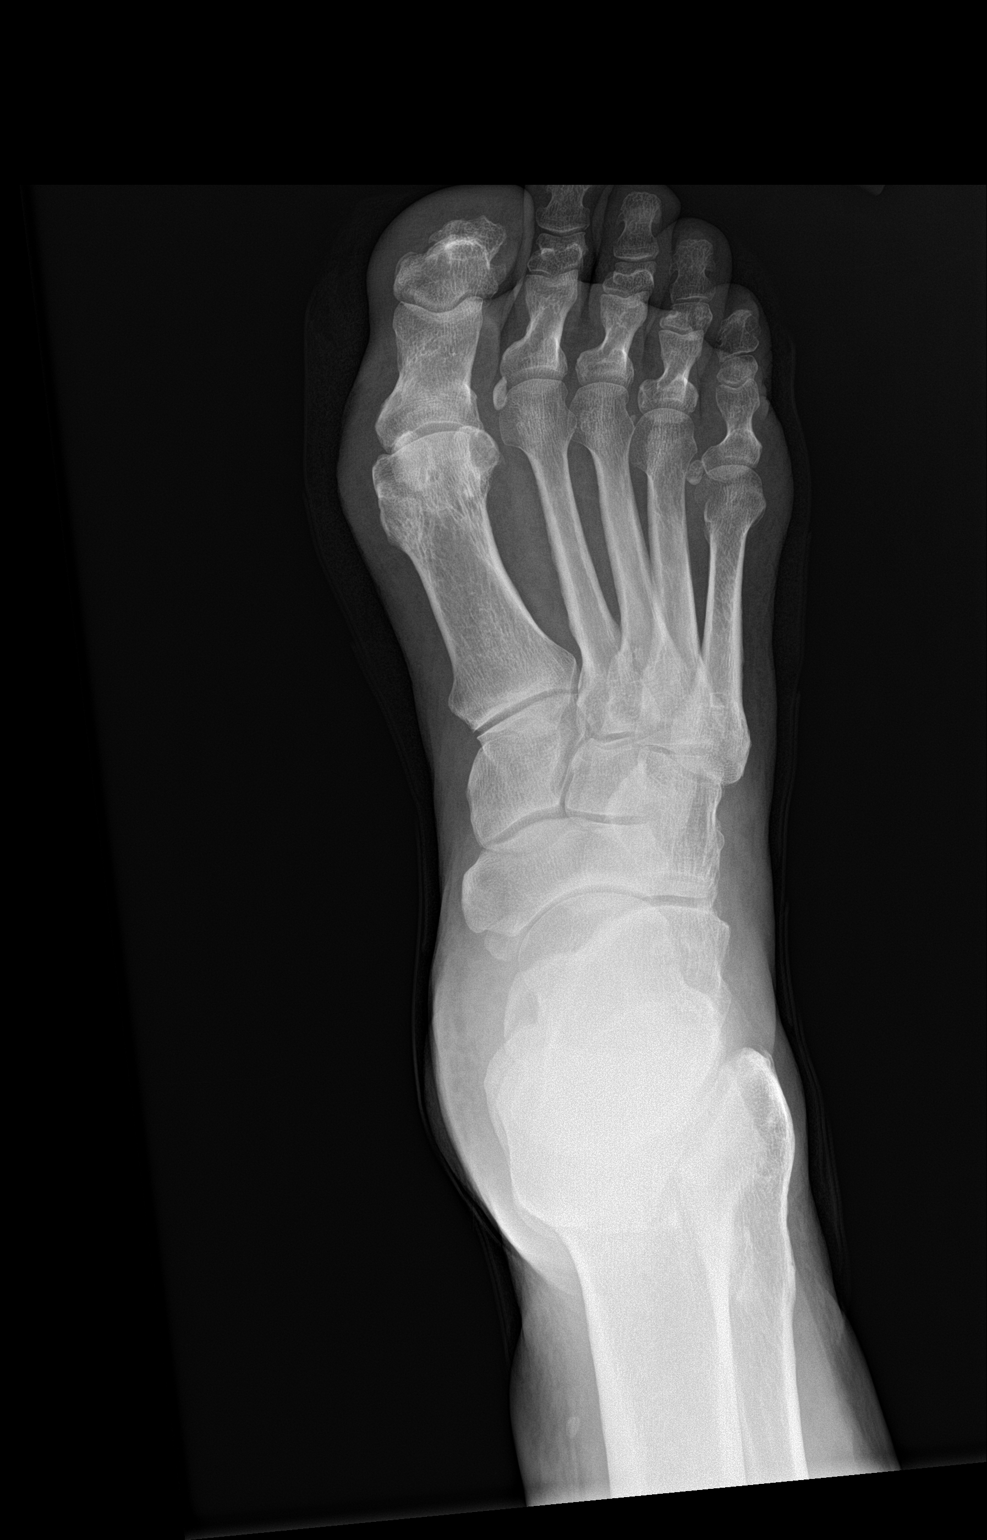

[foot obl]
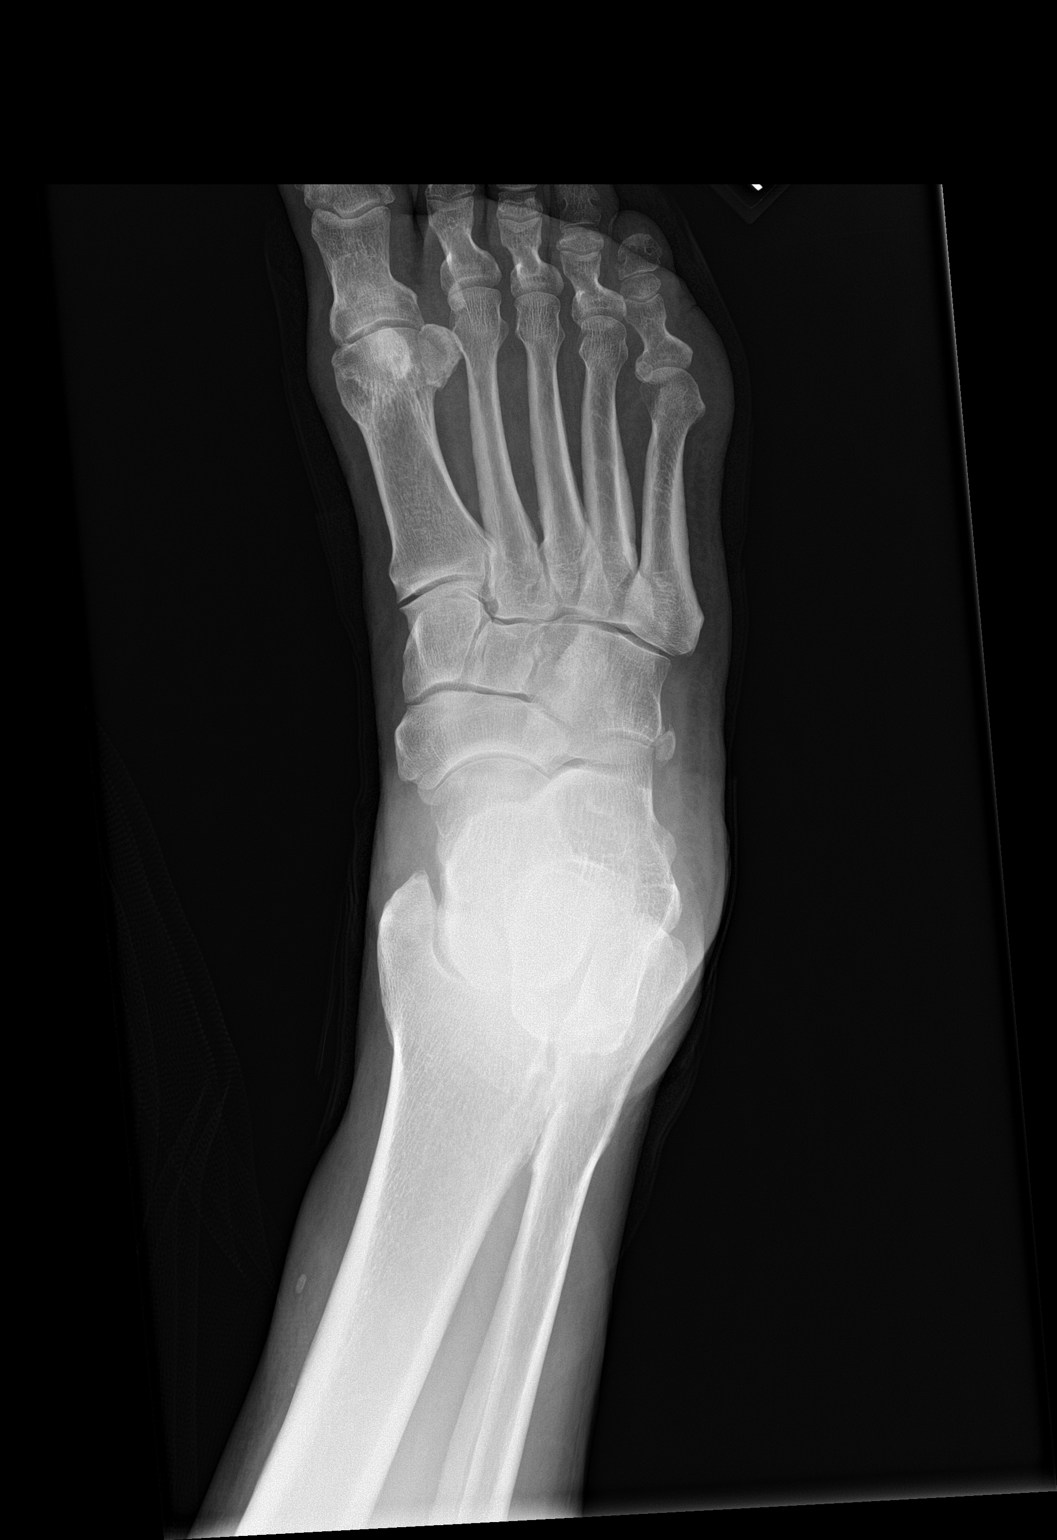

[foot lat]
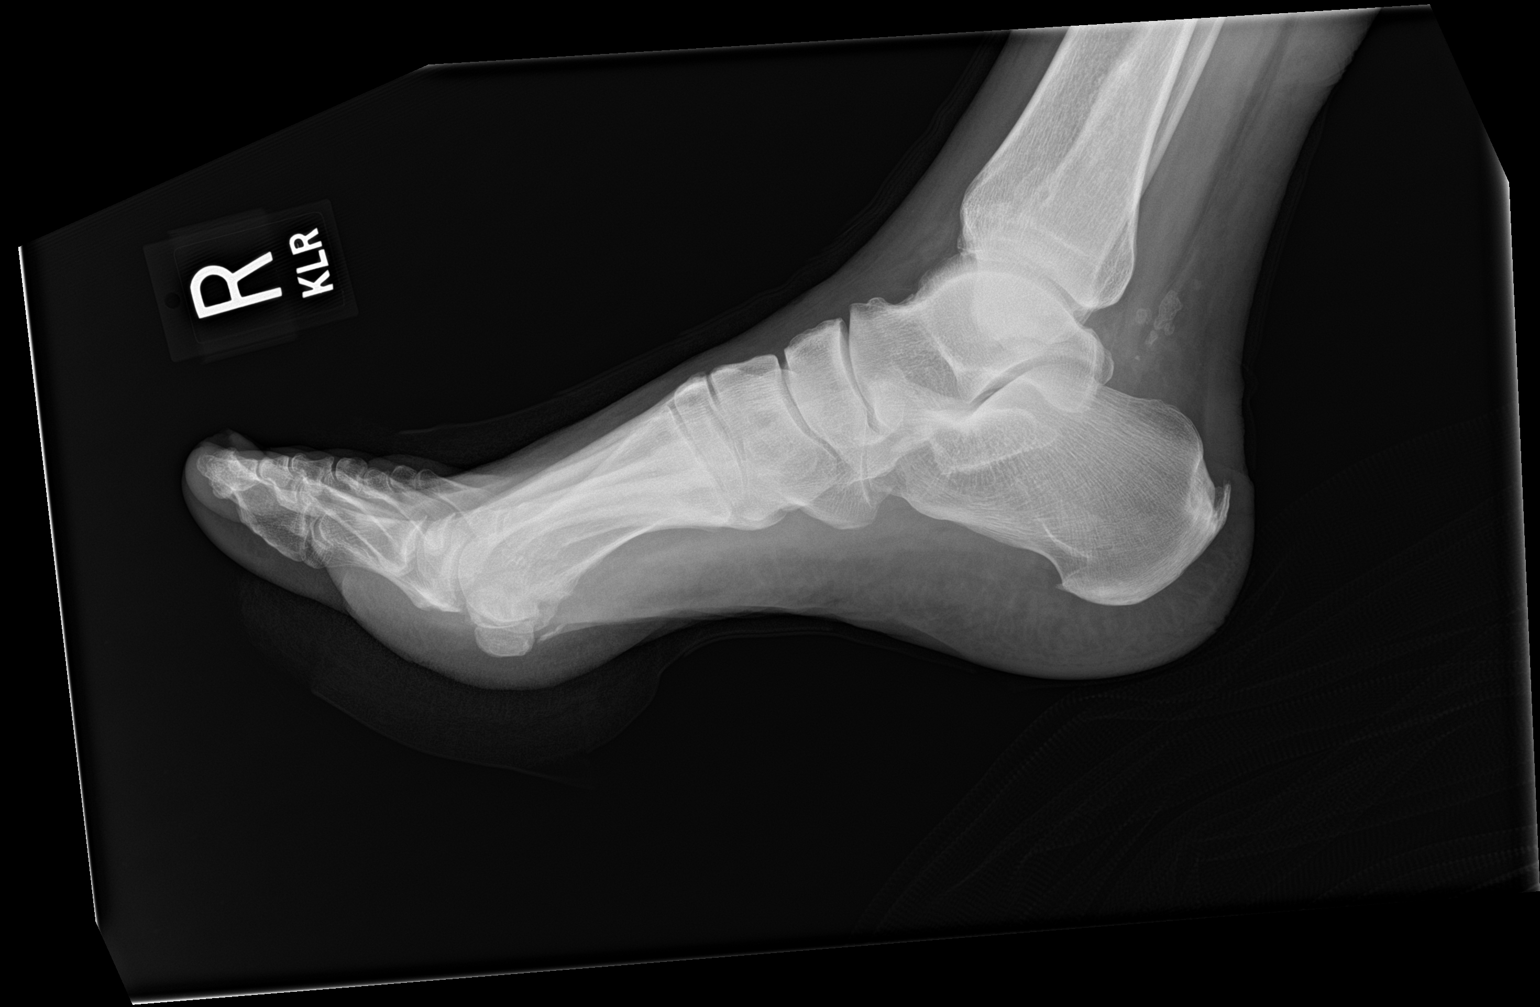

[3 of 3 positions shown; findings below may reference images not displayed]

FINDINGS: There is no evidence of fracture or dislocation. There is no
evidence of arthropathy or other focal bone abnormality. Forefoot
and midfoot bandage. No subcutaneous gas or radiopaque foreign
bodies. Faint sheet like calcification distal plantar soft tissues.
No subcutaneous gas or radiopaque foreign bodies. Small
calcifications in posterior ankle. Os peroneum.
IMPRESSION: No radiographic findings of osteomyelitis or acute osseous process.

## 2021-02-13 ENCOUNTER — Other Ambulatory Visit: Payer: Self-pay | Admitting: Family

## 2021-02-13 ENCOUNTER — Other Ambulatory Visit (HOSPITAL_COMMUNITY): Payer: Self-pay | Admitting: Family

## 2021-02-13 ENCOUNTER — Ambulatory Visit (HOSPITAL_BASED_OUTPATIENT_CLINIC_OR_DEPARTMENT_OTHER)
Admission: RE | Admit: 2021-02-13 | Discharge: 2021-02-13 | Disposition: A | Discharge status: Home / Self Care | Payer: BC Managed Care – PPO | Source: Ambulatory Visit | Attending: Family | Admitting: Family

## 2021-02-13 ENCOUNTER — Other Ambulatory Visit: Payer: Self-pay

## 2021-02-13 DIAGNOSIS — N200 Calculus of kidney: Secondary | ICD-10-CM

## 2021-02-13 DIAGNOSIS — R319 Hematuria, unspecified: Secondary | ICD-10-CM | POA: Diagnosis present

## 2021-03-17 ENCOUNTER — Ambulatory Visit (INDEPENDENT_AMBULATORY_CARE_PROVIDER_SITE_OTHER): Payer: BC Managed Care – PPO

## 2021-03-17 ENCOUNTER — Other Ambulatory Visit: Payer: Self-pay

## 2021-03-17 ENCOUNTER — Other Ambulatory Visit: Payer: Self-pay | Admitting: Podiatry

## 2021-03-17 ENCOUNTER — Ambulatory Visit: Payer: BC Managed Care – PPO | Admitting: Podiatry

## 2021-03-17 DIAGNOSIS — E08621 Diabetes mellitus due to underlying condition with foot ulcer: Secondary | ICD-10-CM

## 2021-03-17 DIAGNOSIS — L97512 Non-pressure chronic ulcer of other part of right foot with fat layer exposed: Secondary | ICD-10-CM

## 2021-03-17 MED ORDER — GENTAMICIN SULFATE 0.1 % EX CREA: 1.0000 "application " | TOPICAL_CREAM | Freq: Two times a day (BID) | CUTANEOUS | 1 refills | Status: DC

## 2021-03-17 MED ORDER — DOXYCYCLINE HYCLATE 100 MG PO TABS: 100.0000 mg | ORAL_TABLET | Freq: Two times a day (BID) | ORAL | 0 refills | Status: DC

## 2021-03-17 NOTE — Progress Notes (Signed)
   Subjective:  55 y.o. male with PMHx of diabetes mellitus presenting as a new patient for evaluation of an ulcer to the plantar aspect of the right great toe.  Patient does have a history of recurrent ulcer in this area.  He has been treated in the past the [facility] clinic with Dr. Orland Jarred.  Patient states that he has had the wound to the plantar aspect of this right great toe for the past month.  This began when he was in [place] at a horse show up on his feet for 18 hours/day.  He presents for further treatment evaluation.  Currently he has not done anything for treatment   Past Medical History:  Diagnosis Date   Diabetes mellitus without complication (HCC)       Objective/Physical Exam General: The patient is alert and oriented x3 in no acute distress.  Dermatology:  Wound #1 noted to the plantar aspect of the right first MTP joint measuring approximately 2.0 x 2.0 x 0.1 cm (LxWxD).   To the noted ulceration(s), there is no eschar. There is a moderate amount of slough, fibrin, and necrotic tissue noted. Granulation tissue and wound base is red. There is a minimal amount of serosanguineous drainage noted. There is no exposed bone muscle-tendon ligament or joint. There is no malodor. Periwound integrity is intact. Skin is warm, dry and supple bilateral lower extremities.  Vascular: Palpable pedal pulses bilaterally. No edema or erythema noted. Capillary refill within normal limits.  Neurological: Epicritic and protective threshold diminished bilaterally.   Musculoskeletal Exam: No pedal deformity noted  Assessment: 1.  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 diabetes mellitus w/ peripheral neuropathy   Plan of Care:  1. Patient was evaluated. 2. medically necessary excisional debridement including subcutaneous tissue was performed using a tissue nipper and a chisel blade. Excisional debridement of all the necrotic nonviable tissue down to healthy  bleeding viable tissue was performed with post-debridement measurements same as pre-. 3. the wound was cleansed and dry sterile dressing applied. 4.  Cultures taken and sent to pathology for culture and sensitivity  5.  Prescription for doxycycline 100 mg 2 times daily #20  6.  Prescription for gentamicin cream.  Apply 2 times daily with a light dressing 7.  Offloading felt dancers pads were applied to the insoles the patient's shoes  8.  Patient is to return to clinic in 2 weeks.  *Horses.  Cutting raining and show horses.   Felecia Shelling, DPM [facility]  Dr. Felecia Shelling, DPM    [address] [place], [place] [address]                Office [phone]  Fax [phone]

## 2021-03-17 NOTE — Addendum Note (Signed)
Addended by: Francesca Oman on: 03/17/2021 04:33 PM   Modules accepted: Orders

## 2021-03-20 LAB — HOUSE ACCOUNT TRACKING

## 2021-03-20 LAB — WOUND CULTURE
MICRO NUMBER:: 12602867
SPECIMEN QUALITY:: ADEQUATE

## 2021-04-07 ENCOUNTER — Other Ambulatory Visit: Payer: Self-pay

## 2021-04-07 ENCOUNTER — Ambulatory Visit: Payer: BC Managed Care – PPO | Admitting: Podiatry

## 2021-04-07 DIAGNOSIS — E08621 Diabetes mellitus due to underlying condition with foot ulcer: Secondary | ICD-10-CM

## 2021-04-07 DIAGNOSIS — L97512 Non-pressure chronic ulcer of other part of right foot with fat layer exposed: Secondary | ICD-10-CM | POA: Diagnosis not present

## 2021-04-14 NOTE — Progress Notes (Signed)
   Subjective:  55 y.o. male with PMHx of diabetes mellitus presenting as a new patient for evaluation of an ulcer to the plantar aspect of the right great toe.  Patient does have a history of recurrent ulcer in this area.  He has been treated in the past the [facility] clinic with Dr. Orland Jarred.  Patient states that he has had the wound to the plantar aspect of this right great toe for the past month.  This began when he was in [place] at a horse show up on his feet for 18 hours/day.  He presents for further treatment evaluation.  Currently he has not done anything for treatment   Past Medical History:  Diagnosis Date   Diabetes mellitus without complication (HCC)       Objective/Physical Exam General: The patient is alert and oriented x3 in no acute distress.  Dermatology:  Wound #1 noted to the plantar aspect of the right first MTP joint measuring approximately 2.0 x 2.0 x 0.1 cm (LxWxD).   To the noted ulceration(s), there is no eschar. There is a moderate amount of slough, fibrin, and necrotic tissue noted. Granulation tissue and wound base is red. There is a minimal amount of serosanguineous drainage noted. There is no exposed bone muscle-tendon ligament or joint. There is no malodor. Periwound integrity is intact. Skin is warm, dry and supple bilateral lower extremities.  Vascular: Palpable pedal pulses bilaterally. No edema or erythema noted. Capillary refill within normal limits.  Neurological: Epicritic and protective threshold diminished bilaterally.   Musculoskeletal Exam: No pedal deformity noted  Radiographic exam RT foot 03/17/2021: Normal osseous mineralization.  No radiographic evidence of cortical breakdown or osteomyelitis.  Cortices intact.  No fractures identified.  Joint spaces mostly preserved.  Normal osseous mineralization  Assessment: 1.  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 diabetes mellitus w/ peripheral neuropathy   Plan of  Care:  1. Patient was evaluated. 2. medically necessary excisional debridement including subcutaneous tissue was performed using a tissue nipper and a chisel blade. Excisional debridement of all the necrotic nonviable tissue down to healthy bleeding viable tissue was performed with post-debridement measurements same as pre-. 3. the wound was cleansed and dry sterile dressing applied. 4.  Postsurgical shoe dispensed.  Weightbearing as tolerated. 5.  Completed doxycycline 100 mg 2 times daily as prescribed 6.  Continue gentamicin cream daily 7.  Return to clinic in 3 weeks  *Horses.  Cutting raining and show horses.   Felecia Shelling, DPM [facility]  Dr. Felecia Shelling, DPM    [address] [place], [place] [address]                Office [phone]  Fax [phone]

## 2021-04-21 ENCOUNTER — Emergency Department (HOSPITAL_COMMUNITY)
Admission: EM | Admit: 2021-04-21 | Discharge: 2021-04-22 | Disposition: A | Discharge status: Home / Self Care | Payer: BC Managed Care – PPO | Attending: Emergency Medicine | Admitting: Emergency Medicine

## 2021-04-21 ENCOUNTER — Emergency Department (HOSPITAL_COMMUNITY): Payer: BC Managed Care – PPO

## 2021-04-21 ENCOUNTER — Encounter (HOSPITAL_COMMUNITY): Payer: Self-pay

## 2021-04-21 ENCOUNTER — Other Ambulatory Visit: Payer: Self-pay

## 2021-04-21 DIAGNOSIS — Z20822 Contact with and (suspected) exposure to covid-19: Secondary | ICD-10-CM | POA: Insufficient documentation

## 2021-04-21 DIAGNOSIS — W01198A Fall on same level from slipping, tripping and stumbling with subsequent striking against other object, initial encounter: Secondary | ICD-10-CM | POA: Diagnosis not present

## 2021-04-21 DIAGNOSIS — S270XXA Traumatic pneumothorax, initial encounter: Secondary | ICD-10-CM | POA: Diagnosis not present

## 2021-04-21 DIAGNOSIS — Y9389 Activity, other specified: Secondary | ICD-10-CM | POA: Insufficient documentation

## 2021-04-21 DIAGNOSIS — S299XXA Unspecified injury of thorax, initial encounter: Secondary | ICD-10-CM | POA: Diagnosis present

## 2021-04-21 DIAGNOSIS — Z7984 Long term (current) use of oral hypoglycemic drugs: Secondary | ICD-10-CM | POA: Insufficient documentation

## 2021-04-21 DIAGNOSIS — E119 Type 2 diabetes mellitus without complications: Secondary | ICD-10-CM | POA: Diagnosis not present

## 2021-04-21 DIAGNOSIS — S2241XA Multiple fractures of ribs, right side, initial encounter for closed fracture: Secondary | ICD-10-CM | POA: Diagnosis not present

## 2021-04-21 LAB — CBC WITH DIFFERENTIAL/PLATELET
Abs Immature Granulocytes: 0.13 10*3/uL — ABNORMAL HIGH (ref 0.00–0.07)
Basophils Absolute: 0.1 10*3/uL (ref 0.0–0.1)
Basophils Relative: 0 %
Eosinophils Absolute: 0 10*3/uL (ref 0.0–0.5)
Eosinophils Relative: 0 %
HCT: 42.4 % (ref 39.0–52.0)
Hemoglobin: 14.3 g/dL (ref 13.0–17.0)
Immature Granulocytes: 1 %
Lymphocytes Relative: 5 %
Lymphs Abs: 1.2 10*3/uL (ref 0.7–4.0)
MCH: 29.8 pg (ref 26.0–34.0)
MCHC: 33.7 g/dL (ref 30.0–36.0)
MCV: 88.3 fL (ref 80.0–100.0)
Monocytes Absolute: 1 10*3/uL (ref 0.1–1.0)
Monocytes Relative: 4 %
Neutro Abs: 20.5 10*3/uL — ABNORMAL HIGH (ref 1.7–7.7)
Neutrophils Relative %: 90 %
Platelets: 268 10*3/uL (ref 150–400)
RBC: 4.8 MIL/uL (ref 4.22–5.81)
RDW: 13.1 % (ref 11.5–15.5)
WBC: 22.9 10*3/uL — ABNORMAL HIGH (ref 4.0–10.5)
nRBC: 0 % (ref 0.0–0.2)

## 2021-04-21 LAB — LACTIC ACID, PLASMA: Lactic Acid, Venous: 1.2 mmol/L (ref 0.5–1.9)

## 2021-04-21 LAB — ETHANOL: Alcohol, Ethyl (B): <10 mg/dL (ref <10)

## 2021-04-21 NOTE — ED Provider Notes (Signed)
[facility] EMERGENCY DEPARTMENT Provider Note   CSN: [id number] Arrival date & time: 04/21/21  2045     History Chief Complaint  Patient presents with   Rib Injury    Casey Figueroa is a 55 y.o. male.  The history is provided by the patient.  He has history of diabetes and comes in following a fall.  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  He is complaining of pain in his right mid and upper back which is worse with certain movements.  He denies any shortness of breath.  He denies other injury.  Pain is rated at 5/10.   Past Medical History:  Diagnosis Date   Diabetes mellitus without complication [facility])     Patient Active Problem List   Diagnosis Date Noted   Appendicitis 08/05/2018   Right foot infection 12/15/2017    Past Surgical History:  Procedure Laterality Date   LAPAROSCOPIC APPENDECTOMY N/A 08/05/2018   Procedure: APPENDECTOMY LAPAROSCOPIC;  Surgeon: Andria Meuse, MD;  Location: [facility] OR;  Service: General;  Laterality: N/A;       No family history on file.  Social History   Tobacco Use   Smoking status: Never   Smokeless tobacco: Never  Substance Use Topics   Alcohol use: Not Currently   Drug use: Not Currently    Home Medications Prior to Admission medications   Medication Sig Start Date End Date Taking? Authorizing Provider  atorvastatin (LIPITOR) 10 MG tablet Take 10 mg by mouth daily.  12/14/17   [provider]  cephALEXin (KEFLEX) 500 MG capsule Take 500 mg by mouth 2 (two) times daily. 03/06/21   [provider]  ciprofloxacin (CIPRO) 500 MG tablet Take 500 mg by mouth 2 (two) times daily. 02/10/21   [provider]  doxycycline (VIBRA-TABS) 100 MG tablet Take 1 tablet (100 mg total) by mouth 2 (two) times daily. 03/17/21   Felecia Shelling, DPM  gentamicin cream (GARAMYCIN) 0.1 % Apply 1 application topically 2 (two) times daily. 03/17/21   Felecia Shelling, DPM  JARDIANCE 25 MG TABS  tablet Take 25 mg by mouth daily.  12/02/17   [provider]  lisinopril (PRINIVIL,ZESTRIL) 5 MG tablet Take 5 mg by mouth daily.  12/13/17   [provider]  metFORMIN (GLUCOPHAGE) 1000 MG tablet Take 1,000 mg by mouth 2 (two) times daily.  12/13/17   [provider]  TRADJENTA 5 MG TABS tablet Take 5 mg by mouth daily. 09/29/17   [provider]  vitamin C (ASCORBIC ACID) 500 MG tablet Take 500 mg by mouth daily.    [provider]    Allergies    Patient has no known allergies.  Review of Systems   Review of Systems  All other systems reviewed and are negative.  Physical Exam Updated Vital Signs BP 135/68 (BP Location: Left Arm)   Pulse 60   Temp 98.6 F (37 C)   Resp 18   Ht 6\' 4"  (1.93 m)   Wt 102.1 kg   SpO2 99%   BMI 27.39 kg/m   Physical Exam Vitals and nursing note reviewed.  55 year old male, resting comfortably and in no acute distress. Vital signs are normal. Oxygen saturation is 99%, which is normal. Head is normocephalic and atraumatic. PERRLA, EOMI. Oropharynx is clear. Neck is nontender and supple without adenopathy or JVD. Back is nontender and there is no CVA tenderness. Lungs have slightly decreased  breath sounds on the right without rales, wheezes, or rhonchi. Chest is moderately tender in the right posterior and lateral chest wall.  There is crepitus present on the right. Heart has regular rate and rhythm without murmur. Abdomen is soft, flat, nontender. Pelvis is stable and nontender. Extremities have no cyanosis or edema, full range of motion is present. Skin is warm and dry without rash. Neurologic: Mental status is normal, cranial nerves are intact, moves all extremities equally.  ED Results / Procedures / Treatments   Labs (all labs ordered are listed, but only abnormal results are displayed) Labs Reviewed  COMPREHENSIVE METABOLIC PANEL - Abnormal; Notable for the following components:      Result Value    Glucose, Bld 205 (*)    BUN 22 (*)    All other components within normal limits  CBC WITH DIFFERENTIAL/PLATELET - Abnormal; Notable for the following components:   WBC 22.9 (*)    Neutro Abs 20.5 (*)    Abs Immature Granulocytes 0.13 (*)    All other components within normal limits  RESP PANEL BY RT-PCR (FLU A&B, COVID) ARPGX2  ETHANOL  LACTIC ACID, PLASMA   Radiology DG Chest 2 View  Result Date: 04/22/2021 CLINICAL DATA:  55 year old male with pneumothorax. EXAM: CHEST - 2 VIEW COMPARISON:  Chest x-ray 04/21/2021. FINDINGS: Again noted is a moderate volume of right-sided pneumothorax occupying approximately 20% of the volume of the right hemithorax, which appears similar to yesterday's examination. No left pneumothorax. Developing opacities in the lung bases suggest areas of subsegmental atelectasis in the lower lobes. No definite consolidative airspace disease. Trace volume of right-sided pleural fluid also noted. No left pleural fluid. No evidence of pulmonary edema. Heart size scratch the pulmonary vasculature is normal. Heart size is normal. Acute fractures of the posterolateral aspects of the right sixth, seventh and eighth ribs are noted, with minimal displacement of the seventh and eighth ribs. IMPRESSION: 1. Right-sided moderate pneumothorax (unchanged) and small right-sided pleural fluid component, as above. This may represent a hydropneumothorax or hemopneumothorax in the setting of rib fractures. 2. Multiple posterolateral right-sided rib fractures again noted, as above. 3. Worsening bibasilar subsegmental atelectasis. Electronically Signed   By: Trudie Reed M.D.   On: 04/22/2021 05:07   DG Ribs Unilateral W/Chest Right  Result Date: 04/21/2021 CLINICAL DATA:  Right posterior rib pain, pain and swelling EXAM: RIGHT RIBS AND CHEST - 3+ VIEW COMPARISON:  11/30/2018 FINDINGS: Frontal view of the chest as well as frontal and oblique views of the right thoracic cage are obtained.  Cardiac silhouette is unremarkable. There is a right-sided pneumothorax, volume estimated less than 20%. No tension effect or midline shift. No pleural effusion. There are minimally displaced right posterior sixth through eighth rib fractures. There may be a nondisplaced right posterior ninth rib fracture is well. No other acute bony abnormalities. IMPRESSION: 1. Right pneumothorax, volume estimated less than 20%. No tension effect. 2. Right posterior sixth through ninth rib fractures. Critical Value/emergent results were called by telephone at the time of interpretation on 04/21/2021 at 10:39 pm to the PA, [facility], who verbally acknowledged these results. Electronically Signed   By: Sharlet Salina M.D.   On: 04/21/2021 22:39    Procedures Procedures  CRITICAL CARE Performed by: Dione Booze Total critical care time: 35 minutes Critical care time was exclusive of separately billable procedures and treating other patients. Critical care was necessary to treat or prevent imminent or life-threatening deterioration. Critical care was time spent personally by me  on the following activities: development of treatment plan with patient and/or surrogate as well as nursing, discussions with consultants, evaluation of patient's response to treatment, examination of patient, obtaining history from patient or surrogate, ordering and performing treatments and interventions, ordering and review of laboratory studies, ordering and review of radiographic studies, pulse oximetry and re-evaluation of patient's condition.  Medications Ordered in ED Medications  HYDROcodone-acetaminophen (NORCO/VICODIN) 5-325 MG per tablet 1 tablet (1 tablet Oral Given 04/22/21 0053)    ED Course  I have reviewed the triage vital signs and the nursing notes.  Pertinent labs & imaging results that were available during my care of the patient were reviewed by me and considered in my medical decision making (see chart for details).   MDM  Rules/Calculators/A&P                         Right posterior chest wall injury with findings suggestive of rib fracture and concerning for possible pneumothorax.  Rib x-rays confirm fractures of the right posterior sixth through ninth ribs with right pneumothorax with volume loss of less than 20%.  I have reviewed the images.  Patient is resting comfortably and is no distress whatsoever, and maintaining good oxygen saturation on room air.  He will need close observation, it is unclear whether he should have a chest tube inserted at this time.  Case is discussed with Dr. Cliffton Asters of trauma surgery service, who recommends observation and repeat chest x-ray in 6 hours.  If pain is under control and x-ray shows no progression of pneumothorax, he would be able to be discharged.  Old records are reviewed, and he has no relevant past visits.  Screening labs are obtained showing significant leukocytosis which is felt to be a stress reaction and not indicative of infection.  He continues to be resting comfortably with normal oxygen saturations at rest.  Repeat x-ray shows no worsening of pneumothorax.  He was ambulated in the emergency department without difficulty.  He is felt to be safe for discharge at this point.  He is discharged with prescription for hydrocodone-acetaminophen, advised to have x-ray repeated in 1 day, and also in 1 week.  Strict return precautions are given.  Final Clinical Impression(s) / ED Diagnoses Final diagnoses:  Closed fracture of four ribs, right, initial encounter  Traumatic pneumothorax, initial encounter    Rx / DC Orders ED Discharge Orders          Ordered    HYDROcodone-acetaminophen (NORCO) 5-325 MG tablet  Every 4 hours PRN        04/22/21 0527             Dione Booze, MD 04/22/21 [phone]

## 2021-04-21 NOTE — ED Triage Notes (Signed)
Pt presents to the ED from home with complaints of right rib pain. 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 Pt complaining of pain and swelling, difficulty taking deep breaths.

## 2021-04-21 NOTE — ED Provider Notes (Signed)
Emergency Medicine Provider Triage Evaluation Note  Casey Figueroa , a 55 y.o. male  was evaluated in triage.  Pt complains of right posterior rib pain that occurred just prior to arrival.  Patient states that he was moving some callus when he tripped and fell backwards onto a feeding trough.  Denies any other injury.  Did not hit his head or lose conscious.  Complaining of shortness of breath.  He currently rates his pain 5/10 in severity.  Review of Systems  Positive:  Negative: See above   Physical Exam  BP 135/68 (BP Location: Left Arm)   Pulse 60   Temp 98.6 F (37 C)   Resp 18   Ht 6\' 4"  (1.93 m)   Wt 102.1 kg   SpO2 99%   BMI 27.39 kg/m  Gen:   Awake, no distress   Resp:  Normal effort  MSK:   Moves extremities without difficulty  Other:  Tenderness to the right posterior ribs.  There is a superficial abrasion over the area.  Medical Decision Making  Medically screening exam initiated at 10:03 PM.  Appropriate orders placed.  was informed that the remainder of the evaluation will be completed by another provider, this initial triage assessment does not replace that evaluation, and the importance of remaining in the ED until their evaluation is complete.     Lacie Draft, PA-C 04/21/21 2204    2205, MD 04/21/21 2308

## 2021-04-22 ENCOUNTER — Emergency Department (HOSPITAL_COMMUNITY): Payer: BC Managed Care – PPO

## 2021-04-22 LAB — COMPREHENSIVE METABOLIC PANEL
ALT: 22 U/L (ref 0–44)
AST: 23 U/L (ref 15–41)
Albumin: 4.2 g/dL (ref 3.5–5.0)
Alkaline Phosphatase: 61 U/L (ref 38–126)
Anion gap: 8 (ref 5–15)
BUN: 22 mg/dL — ABNORMAL HIGH (ref 6–20)
CO2: 25 mmol/L (ref 22–32)
Calcium: 9.3 mg/dL (ref 8.9–10.3)
Chloride: 105 mmol/L (ref 98–111)
Creatinine, Ser: 1.09 mg/dL (ref 0.61–1.24)
GFR, Estimated: >60 mL/min (ref >60)
Glucose, Bld: 205 mg/dL — ABNORMAL HIGH (ref 70–99)
Potassium: 4.3 mmol/L (ref 3.5–5.1)
Sodium: 138 mmol/L (ref 135–145)
Total Bilirubin: 0.8 mg/dL (ref 0.3–1.2)
Total Protein: 8 g/dL (ref 6.5–8.1)

## 2021-04-22 LAB — RESP PANEL BY RT-PCR (FLU A&B, COVID) ARPGX2
Influenza A by PCR: NEGATIVE
Influenza B by PCR: NEGATIVE
SARS Coronavirus 2 by RT PCR: NEGATIVE

## 2021-04-22 MED ORDER — HYDROCODONE-ACETAMINOPHEN 5-325 MG PO TABS
1.0000 | ORAL_TABLET | Freq: Once | ORAL | Status: AC
  Administered 2021-04-22: 1 via ORAL
  Filled 2021-04-22: qty 1

## 2021-04-22 MED ORDER — HYDROCODONE-ACETAMINOPHEN 5-325 MG PO TABS: 1.0000 | ORAL_TABLET | ORAL | 0 refills | Status: DC | PRN

## 2021-04-22 NOTE — ED Notes (Addendum)
Pt ambulated from room to bathroom independently. Gait was stable. Pt denies SOB. Pain tolerable. Secure message sent to Dr Preston Fleeting.

## 2021-04-22 NOTE — ED Notes (Signed)
Pt provided with incentive spirometer. Pt was able to demonstrate appropriate use. No questions at this time. S/O at bedside.

## 2021-04-22 NOTE — Discharge Instructions (Addendum)
Apply ice for 30 minutes at a time, 4 times a day.  Use the incentive spirometer every 2 hours through the day.  For pain, you may take ibuprofen, acetaminophen, or hydrocodone-acetaminophen.  You may combine ibuprofen and acetaminophen, and you may combine ibuprofen with hydrocodone-acetaminophen.  These combinations will give you better pain relief than either medication by itself.  Do not combine acetaminophen with hydrocodone-acetaminophen because that can create an overdose of acetaminophen.  Please contact your primary care provider to have your x-ray repeated in 1 day, and also in 1 week.  At any point, if you are having difficulty breathing or if pain is not being controlled, return to the emergency department.

## 2021-04-22 NOTE — ED Notes (Signed)
Discharge instructions including follow up care and pain management discussed with pt. Pt verbalized understanding with no questions at this time. Pt to go home with s/o at bedside.  °

## 2021-04-22 NOTE — ED Notes (Signed)
Patient transported to X-ray 

## 2021-04-23 ENCOUNTER — Other Ambulatory Visit: Payer: Self-pay

## 2021-04-23 ENCOUNTER — Ambulatory Visit (HOSPITAL_COMMUNITY)
Admission: RE | Admit: 2021-04-23 | Discharge: 2021-04-23 | Disposition: A | Discharge status: Home / Self Care | Payer: BC Managed Care – PPO | Source: Ambulatory Visit | Attending: Family | Admitting: Family

## 2021-04-23 ENCOUNTER — Other Ambulatory Visit (HOSPITAL_COMMUNITY): Payer: Self-pay | Admitting: Family

## 2021-04-23 DIAGNOSIS — R0782 Intercostal pain: Secondary | ICD-10-CM

## 2021-04-24 ENCOUNTER — Other Ambulatory Visit (HOSPITAL_COMMUNITY): Payer: Self-pay | Admitting: Family

## 2021-04-24 ENCOUNTER — Ambulatory Visit (HOSPITAL_COMMUNITY)
Admission: RE | Admit: 2021-04-24 | Discharge: 2021-04-24 | Disposition: A | Discharge status: Home / Self Care | Payer: BC Managed Care – PPO | Source: Ambulatory Visit | Attending: Family | Admitting: Family

## 2021-04-24 DIAGNOSIS — R52 Pain, unspecified: Secondary | ICD-10-CM | POA: Insufficient documentation

## 2021-04-25 ENCOUNTER — Other Ambulatory Visit: Payer: Self-pay

## 2021-04-25 ENCOUNTER — Encounter (HOSPITAL_COMMUNITY): Payer: Self-pay | Admitting: Emergency Medicine

## 2021-04-25 ENCOUNTER — Emergency Department (HOSPITAL_COMMUNITY): Payer: BC Managed Care – PPO

## 2021-04-25 ENCOUNTER — Emergency Department (HOSPITAL_COMMUNITY)
Admission: EM | Admit: 2021-04-25 | Discharge: 2021-04-25 | Disposition: A | Discharge status: Home / Self Care | Payer: BC Managed Care – PPO | Attending: Student | Admitting: Student

## 2021-04-25 DIAGNOSIS — S270XXD Traumatic pneumothorax, subsequent encounter: Secondary | ICD-10-CM | POA: Diagnosis present

## 2021-04-25 DIAGNOSIS — Z7984 Long term (current) use of oral hypoglycemic drugs: Secondary | ICD-10-CM | POA: Insufficient documentation

## 2021-04-25 DIAGNOSIS — E119 Type 2 diabetes mellitus without complications: Secondary | ICD-10-CM | POA: Insufficient documentation

## 2021-04-25 DIAGNOSIS — X58XXXD Exposure to other specified factors, subsequent encounter: Secondary | ICD-10-CM | POA: Diagnosis not present

## 2021-04-25 MED ORDER — METHOCARBAMOL 500 MG PO TABS: 500.0000 mg | ORAL_TABLET | Freq: Two times a day (BID) | ORAL | 0 refills | Status: AC

## 2021-04-25 MED ORDER — HYDROCODONE-ACETAMINOPHEN 5-325 MG PO TABS: 1.0000 | ORAL_TABLET | ORAL | 0 refills | Status: AC | PRN

## 2021-04-25 NOTE — ED Triage Notes (Addendum)
Patient here for evaluation of of right rib fractures, sent to [facility] by PCP for further evaluation of hydropneumothorax, x-ray result from yesterday in Epic. Patient alert, oriented, ambulatory, and in no apparent distress at this time.

## 2021-04-25 NOTE — Discharge Instructions (Signed)
Please follow up with either Dr. Janee Morn or Dr. Bedelia Person at [facility] for further evaluation of your pneumothorax.   Pick up pain medication and use as needed.  DO NOT DRIVE OR DRINK ALCOHOL WHILE ON THE MUSCLE RELAXER AS IT CAN MAKE YOU DROWSY.  You can also buy OTC Voltaren gel to apply to your chest wall to help with pain.  Ice the area as needed.  Continue using the incentive spirometer as indicated.   I would also recommend buying a pulse oximeter OTC to check oxygen levels at home.   Return to the ED IMMEDIATELY for any new/worsening symptoms including worsening pain, worsening shortness of breath, pulse readings < 90%, coughing up blood, or fevers > 100.4.

## 2021-04-25 NOTE — ED Provider Notes (Signed)
[facility] EMERGENCY DEPARTMENT Provider Note   CSN: [id number] Arrival date & time: 04/25/21  1043     History Chief Complaint  Patient presents with   Rib Injury    Casey Figueroa is a 55 y.o. male with PMHx diabetes who presents to the ED today s/p rib injury that occurred 1 week ago.  Per chart review patient was initially seen in the ED on 1/12 status post fall after landing against a trauma with his right ribs.  He had a chest x-ray done at that time which did show right-sided pneumothorax with volume estimated less than 20% as well as right posterior sixth through ninth rib fractures.  Trauma was consulted and recommended repeat chest x-ray and if stable he would be able to be discharged home without intervention.  Repeat chest x-ray stable and patient was discharged home with plans for additional x-ray at a later as well as 1 week later.  He had a repeat x-ray done on 12/14 that showed minimal enlargement of the moderate right-sided hydropneumothorax from approximately 20 to 25%.  Repeat x-ray yesterday with unchanged pneumothorax however patient was advised by PCP to come to the ED for further evaluation.   States he has been doing well at home and using incentive spirometer as indicated.  He does report that he has pain with movement however at rest his pain is gone.  He denies any shortness of breath.   The history is provided by the patient and medical records.      Past Medical History:  Diagnosis Date   Diabetes mellitus without complication [facility])     Patient Active Problem List   Diagnosis Date Noted   Appendicitis 08/05/2018   Right foot infection 12/15/2017    Past Surgical History:  Procedure Laterality Date   LAPAROSCOPIC APPENDECTOMY N/A 08/05/2018   Procedure: APPENDECTOMY LAPAROSCOPIC;  Surgeon: Andria Meuse, MD;  Location: [facility] OR;  Service: General;  Laterality: N/A;       No family history on file.  Social History   Tobacco  Use   Smoking status: Never   Smokeless tobacco: Never  Substance Use Topics   Alcohol use: Not Currently   Drug use: Not Currently    Home Medications Prior to Admission medications   Medication Sig Start Date End Date Taking? Authorizing Provider  dutasteride (AVODART) 0.5 MG capsule Take 0.5 mg by mouth daily. 02/14/21  Yes [provider]  gentamicin cream (GARAMYCIN) 0.1 % Apply 1 application topically 2 (two) times daily. 03/17/21  Yes Felecia Shelling, DPM  JARDIANCE 25 MG TABS tablet Take 25 mg by mouth daily.  12/02/17  Yes [provider]  lisinopril (PRINIVIL,ZESTRIL) 5 MG tablet Take 5 mg by mouth daily.  12/13/17  Yes [provider]  metFORMIN (GLUCOPHAGE) 1000 MG tablet Take 1,000 mg by mouth 2 (two) times daily.  12/13/17  Yes [provider]  methocarbamol (ROBAXIN) 500 MG tablet Take 1 tablet (500 mg total) by mouth 2 (two) times daily. 04/25/21  Yes Equilla Que, PA-C  Nutritional Supplements (PROSTATE PO) Take 2 tablets by mouth in the morning and at bedtime. Prostate Strong   Yes [provider]  OVER THE COUNTER MEDICATION Take 3 tablets by mouth daily. Glucose control   Yes [provider]  vitamin C (ASCORBIC ACID) 500 MG tablet Take 500 mg by mouth daily.   Yes [provider]  Zinc 50 MG CAPS Take 1 capsule by mouth daily.  Yes [provider]  atorvastatin (LIPITOR) 10 MG tablet Take 10 mg by mouth daily.  Patient not taking: Reported on 04/25/2021 12/14/17   [provider]  HYDROcodone-acetaminophen (NORCO) 5-325 MG tablet Take 1 tablet by mouth every 4 (four) hours as needed for moderate pain. 04/25/21   Tanda Rockers, PA-C    Allergies    Other  Review of Systems   Review of Systems  Constitutional:  Negative for chills and fever.  Respiratory:  Negative for cough and shortness of breath.   Cardiovascular:  Positive for chest pain.  Gastrointestinal:  Negative for nausea and  vomiting.  All other systems reviewed and are negative.  Physical Exam Updated Vital Signs BP 135/79    Pulse (!) 56    Temp 97.6 F (36.4 C) (Oral)    Resp 12    SpO2 95%   Physical Exam Vitals and nursing note reviewed.  Constitutional:      Appearance: He is not ill-appearing or diaphoretic.  HENT:     Head: Normocephalic and atraumatic.  Eyes:     Conjunctiva/sclera: Conjunctivae normal.  Cardiovascular:     Rate and Rhythm: Normal rate and regular rhythm.     Pulses: Normal pulses.  Pulmonary:     Effort: Pulmonary effort is normal.     Breath sounds: Normal breath sounds. No wheezing, rhonchi or rales.     Comments: Speaking in full sentences. Satting 96% on RA. Slight diminished breath sounds to right base.  Chest:     Chest wall: No tenderness.  Abdominal:     Palpations: Abdomen is soft.     Tenderness: There is no abdominal tenderness.  Musculoskeletal:     Cervical back: Neck supple.  Skin:    General: Skin is warm and dry.  Neurological:     Mental Status: He is alert.    ED Results / Procedures / Treatments   Labs (all labs ordered are listed, but only abnormal results are displayed) Labs Reviewed - No data to display  EKG None  Radiology DG Ribs Unilateral W/Chest Right  Result Date: 04/25/2021 CLINICAL DATA:  Right rib fractures. Hydropneumothorax from yesterday. EXAM: RIGHT RIBS AND CHEST - 3+ VIEW COMPARISON:  1 day prior FINDINGS: Right-sided rib fractures have been delineated previously. Examples involving the posterior sixth, seventh, and eighth ribs. No significant change in alignment. Midline trachea.  Normal heart size.  Trace right pleural fluid. The moderate right-sided pneumothorax is felt to be minimally improved. On the order of 20%. Right infrahilar atelectasis is not significantly changed. No new consolidation. IMPRESSION: Right-sided hydropneumothorax, with the pleural air component felt to be slightly decreased. Similar appearance of  right sixth through eighth rib fractures. Electronically Signed   By: Jeronimo Greaves M.D.   On: 04/25/2021 13:17   DG Ribs Unilateral W/Chest Right  Result Date: 04/24/2021 CLINICAL DATA:  Pain. EXAM: RIGHT RIBS AND CHEST - 3+ VIEW COMPARISON:  04/23/2021 and rib examination 04/21/2021 FINDINGS: Again noted is a right hydropneumothorax. The pleural air component has minimally changed from the previous examination and measures roughly 20-25%. Again noted is a bandlike atelectasis in the right hilum. Few densities in left lower chest are similar to the previous examination and suggestive for atelectasis. Negative for a left pneumothorax. Small amount of subcutaneous gas in the right lower neck. Displaced fractures involving the right sixth, seventh eighth ribs. The right eighth fracture is probably a segmental fracture. Difficult to exclude subtle fractures involving the right fifth and  ninth ribs. Right clavicle is intact. Normal alignment at the right [facility] joint. IMPRESSION: 1. Stable size of the right hydropneumothorax. 2. Displaced fractures involving the right sixth, seventh and eighth ribs. 3. Volume loss and atelectasis in the right lung. Electronically Signed   By: Richarda Overlie M.D.   On: 04/24/2021 17:03    Procedures Procedures   Medications Ordered in ED Medications - No data to display  ED Course  I have reviewed the triage vital signs and the nursing notes.  Pertinent labs & imaging results that were available during my care of the patient were reviewed by me and considered in my medical decision making (see chart for details).  Clinical Course as of 04/25/21 1413  Fri Apr 25, 2021  1338 Lovick or thompson in the office [MV]    Clinical Course User Index [MV] Tanda Rockers, [place]   MDM Rules/Calculators/A&P                          55 year old male who presents to the ED today with recurrent known pneumothorax on right side.  Seen in the ED initially 12/12 however discharged home  after stable pneumothorax on repeat chest x-ray without intervention.  X-ray with slight worsening 2 days ago and sent by PCP for further evaluation.  On arrival to the ED vitals are stable.  Patient appears to be no acute distress.  He is resting comfortably at bedside.  He is speaking in full sentences and satting 96% on room air.  He does have some slight diminished breath sounds at the bases of the right lung.  He denies any fevers, chills, shortness of breath, cough.  We will plan for repeat x-ray and likely reconsult to trauma given their initial recommendations of discharge home/for further reccs.   CXR: IMPRESSION:  Right-sided hydropneumothorax, with the pleural air component felt  to be slightly decreased.     Similar appearance of right sixth through eighth rib fractures.   Discussed case with trauma surgeon Dr. Violeta Gelinas who recommends no further intervention.  Can follow-up in the office with either himself or Dr. Bedelia Person.  Recommends discharging home with additional pain medication and muscle relaxer. PT and family updated on plan and all questions/concerns have been addressed with them at bedside. They have been instructed on strict return precautions including worsening pain, pulse ox < 90%, coughing up blood, fevers> 100.4, or any other new/concerning symptoms. They are in agreement with plan and pt stable for discharge.   This note was prepared using Dragon voice recognition software and may include unintentional dictation errors due to the inherent limitations of voice recognition software.      Final Clinical Impression(s) / ED Diagnoses Final diagnoses:  Traumatic pneumothorax, subsequent encounter    Rx / DC Orders ED Discharge Orders          Ordered    HYDROcodone-acetaminophen (NORCO) 5-325 MG tablet  Every 4 hours PRN        04/25/21 1411    methocarbamol (ROBAXIN) 500 MG tablet  2 times daily        04/25/21 1411             Discharge Instructions       Please follow up with either Dr. Janee Morn or Dr. Bedelia Person at [facility] for further evaluation of your pneumothorax.   Pick up pain medication and use as needed.  DO NOT DRIVE OR DRINK ALCOHOL WHILE ON THE  MUSCLE RELAXER AS IT CAN MAKE YOU DROWSY.  You can also buy OTC Voltaren gel to apply to your chest wall to help with pain.  Ice the area as needed.  Continue using the incentive spirometer as indicated.   I would also recommend buying a pulse oximeter OTC to check oxygen levels at home.   Return to the ED IMMEDIATELY for any new/worsening symptoms including worsening pain, worsening shortness of breath, pulse readings < 90%, coughing up blood, or fevers > 100.4.        Tanda Rockers, PA-C 04/25/21 1413    Glendora Score, MD 04/25/21 [phone]

## 2021-04-28 ENCOUNTER — Ambulatory Visit (HOSPITAL_COMMUNITY)
Admission: RE | Admit: 2021-04-28 | Discharge: 2021-04-28 | Disposition: A | Discharge status: Home / Self Care | Payer: BC Managed Care – PPO | Source: Ambulatory Visit | Attending: Family | Admitting: Family

## 2021-04-28 ENCOUNTER — Other Ambulatory Visit (HOSPITAL_COMMUNITY): Payer: Self-pay | Admitting: Family

## 2021-04-28 ENCOUNTER — Ambulatory Visit: Payer: BC Managed Care – PPO | Admitting: Podiatry

## 2021-04-28 ENCOUNTER — Other Ambulatory Visit: Payer: Self-pay

## 2021-04-28 DIAGNOSIS — R0782 Intercostal pain: Secondary | ICD-10-CM | POA: Insufficient documentation

## 2021-04-28 DIAGNOSIS — E08621 Diabetes mellitus due to underlying condition with foot ulcer: Secondary | ICD-10-CM

## 2021-04-28 DIAGNOSIS — L97512 Non-pressure chronic ulcer of other part of right foot with fat layer exposed: Secondary | ICD-10-CM | POA: Diagnosis not present

## 2021-04-28 NOTE — Progress Notes (Signed)
Subjective:  55 y.o. male with PMHx of diabetes mellitus presenting for follow-up evaluation of an ulcer to the plantar aspect of the right great toe as well as some interdigital maceration between the fourth and fifth digits with a wound that is developed to the lateral aspect of the fourth toe.  Patient does have a history of recurrent ulcer in this area.  He has been treated in the past the [facility] clinic with Dr. Orland Jarred.  The patient sustained rib fractures on 04/21/2021.  Since that time he has reduced his activity and been resting his entire body which is helped his foot significantly.  He says his wound on his toe is doing much better.  He has been applying the antibiotic cream as instructed.  He also completed the oral doxycycline as prescribed.  He has been mostly weightbearing in the postsurgical shoe that was dispensed last visit.  He presents for further treatment and evaluation   Past Medical History:  Diagnosis Date   Diabetes mellitus without complication [facility])    Past Surgical History:  Procedure Laterality Date   LAPAROSCOPIC APPENDECTOMY N/A 08/05/2018   Procedure: APPENDECTOMY LAPAROSCOPIC;  Surgeon: Andria Meuse, MD;  Location: [facility] OR;  Service: General;  Laterality: N/A;   Allergies  Allergen Reactions   Other Hives    Duck sauce        Objective/Physical Exam General: The patient is alert and oriented x3 in no acute distress.  Dermatology:  Wound #1 noted to the plantar aspect of the right first MTP joint measuring approximately 1.0 x 0.6 x 0.2 cm (LxWxD).  Overall there is significant improvement of the wound of the last 3 weeks  To the noted ulceration(s), there is no eschar. There is a moderate amount of slough, fibrin, and necrotic tissue noted. Granulation tissue and wound base is red. There is a minimal amount of serosanguineous drainage noted. There is no exposed bone muscle-tendon ligament or joint. There is no malodor. Periwound integrity is  intact. Skin is warm, dry and supple bilateral lower extremities.  Vascular: Palpable pedal pulses bilaterally. No edema or erythema noted. Capillary refill within normal limits.  Neurological: Epicritic and protective threshold diminished bilaterally.   Musculoskeletal Exam: No pedal deformity noted  Radiographic exam RT foot 03/17/2021: Normal osseous mineralization.  No radiographic evidence of cortical breakdown or osteomyelitis.  Cortices intact.  No fractures identified.  Joint spaces mostly preserved.  Normal osseous mineralization  Assessment: 1.  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 diabetes mellitus w/ peripheral neuropathy   Plan of Care:  1. Patient was evaluated.  Overall there is significant improvement of the wound 2. medically necessary excisional debridement including subcutaneous tissue was performed using a tissue nipper and a chisel blade. Excisional debridement of all the necrotic nonviable tissue down to healthy bleeding viable tissue was performed with post-debridement measurements same as pre-. 3. the wound was cleansed and dry sterile dressing applied. 4.  Continue the postsurgical shoe weightbearing as tolerated.  The surgical shoe has offloading felt pads to offload pressure from the first MTP joint 5.  Continue gentamicin cream and light dressing daily 6.  Return to clinic in 3 weeks  *Horses.  Cutting reining and show horses.   Felecia Shelling, DPM [facility]  Dr. Felecia Shelling, DPM    [address]  [place], [place] [address]                Office [phone]  Fax [phone]

## 2021-05-28 ENCOUNTER — Ambulatory Visit: Payer: BC Managed Care – PPO | Admitting: Podiatry

## 2021-05-28 ENCOUNTER — Other Ambulatory Visit: Payer: Self-pay

## 2021-05-28 DIAGNOSIS — L97512 Non-pressure chronic ulcer of other part of right foot with fat layer exposed: Secondary | ICD-10-CM

## 2021-05-28 DIAGNOSIS — E08621 Diabetes mellitus due to underlying condition with foot ulcer: Secondary | ICD-10-CM | POA: Diagnosis not present

## 2021-06-02 NOTE — Progress Notes (Signed)
° °  Subjective:  56 y.o. male with PMHx of diabetes mellitus presenting for follow-up evaluation of an ulcer to the plantar aspect of the right great toe.  Patient believes that he is doing better.  He has some minor drainage.  He says he is on his feet for a long period of time throughout the day.  He has been applying the antibiotic cream as directed.  No new complaints at this time   He has been mostly weightbearing in the postsurgical shoe that was dispensed last visit.  He presents for further treatment and evaluation   Past Medical History:  Diagnosis Date   Diabetes mellitus without complication [facility])    Past Surgical History:  Procedure Laterality Date   LAPAROSCOPIC APPENDECTOMY N/A 08/05/2018   Procedure: APPENDECTOMY LAPAROSCOPIC;  Surgeon: Andria Meuse, MD;  Location: [facility] OR;  Service: General;  Laterality: N/A;   Allergies  Allergen Reactions   Other Hives    Duck sauce         Objective/Physical Exam General: The patient is alert and oriented x3 in no acute distress.  Dermatology:  Wound #1 noted to the plantar aspect of the right first MTP joint measuring approximately 1.0 x 1.0 x 0.2 cm (LxWxD).   To the noted ulceration(s), there is no eschar. There is a moderate amount of slough, fibrin, and necrotic tissue noted. Granulation tissue and wound base is red. There is a minimal amount of serosanguineous drainage noted. There is no exposed bone muscle-tendon ligament or joint. There is no malodor. Periwound integrity is intact. Skin is warm, dry and supple bilateral lower extremities.  Vascular: Palpable pedal pulses bilaterally. No edema or erythema noted. Capillary refill within normal limits.  Neurological: Epicritic and protective threshold diminished bilaterally.   Musculoskeletal Exam: No pedal deformity noted   Assessment: 1.  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 diabetes mellitus w/ peripheral neuropathy   Plan of  Care:  1. Patient was evaluated.   2. medically necessary excisional debridement including subcutaneous tissue was performed using a tissue nipper and a chisel blade. Excisional debridement of all the necrotic nonviable tissue down to healthy bleeding viable tissue was performed with post-debridement measurements same as pre-. 3. the wound was cleansed and dry sterile dressing applied. 4.  Continue the postsurgical shoe weightbearing as tolerated.  The surgical shoe has offloading felt pads to offload pressure from the first MTP joint 5.  Continue gentamicin cream and light dressing daily 6.  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 Felecia Shelling, DPM    [address] [place], [place] [address]                Office [phone]  Fax [phone]

## 2021-06-30 ENCOUNTER — Ambulatory Visit (INDEPENDENT_AMBULATORY_CARE_PROVIDER_SITE_OTHER): Payer: BC Managed Care – PPO

## 2021-06-30 ENCOUNTER — Other Ambulatory Visit: Payer: Self-pay

## 2021-06-30 ENCOUNTER — Ambulatory Visit: Payer: BC Managed Care – PPO | Admitting: Podiatry

## 2021-06-30 DIAGNOSIS — E08621 Diabetes mellitus due to underlying condition with foot ulcer: Secondary | ICD-10-CM

## 2021-06-30 DIAGNOSIS — L97512 Non-pressure chronic ulcer of other part of right foot with fat layer exposed: Secondary | ICD-10-CM | POA: Diagnosis not present

## 2021-06-30 DIAGNOSIS — E0843 Diabetes mellitus due to underlying condition with diabetic autonomic (poly)neuropathy: Secondary | ICD-10-CM

## 2021-06-30 NOTE — Progress Notes (Signed)
° °  Subjective:  56 y.o. male with PMHx of diabetes mellitus presenting for follow-up evaluation of an ulcer to the plantar aspect of the right great toe.  Patient states that he is doing much better.  He is only experienced some minor drainage over the past 2 days.  He is very active and wears his work boots with the offloading felt dancers pads to offload pressure from the Great toe joint.  He has been applying the antibiotic gentamicin cream and a light dressing daily.  Presenting for further treatment and evaluation   Past Medical History:  Diagnosis Date   Diabetes mellitus without complication [facility])    Past Surgical History:  Procedure Laterality Date   LAPAROSCOPIC APPENDECTOMY N/A 08/05/2018   Procedure: APPENDECTOMY LAPAROSCOPIC;  Surgeon: Andria Meuse, MD;  Location: [facility] OR;  Service: General;  Laterality: N/A;   Allergies  Allergen Reactions   Other Hives    Duck sauce        Objective/Physical Exam General: The patient is alert and oriented x3 in no acute distress.  Dermatology:  Wound #1 noted to the plantar aspect of the right first MTP joint measuring approximately 0.6x0.6 x 0.2 cm (LxWxD).   To the noted ulceration(s), there is no eschar. There is a moderate amount of slough, fibrin, and necrotic tissue noted. Granulation tissue and wound base is red. There is a minimal amount of serosanguineous drainage noted. There is no exposed bone muscle-tendon ligament or joint. There is no malodor. Periwound integrity is intact. Skin is warm, dry and supple bilateral lower extremities.  Vascular: Palpable pedal pulses bilaterally. No edema or erythema noted. Capillary refill within normal limits.  Neurological: Epicritic and protective threshold diminished bilaterally.   Musculoskeletal Exam: No pedal deformity noted  Radiographic exam RT foot 06/30/2021: Normal osseous mineralization.  No radiographic evidence of erosion or periosteal reaction.   Assessment: 1.   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 diabetes mellitus w/ peripheral neuropathy   Plan of Care:  1. Patient was evaluated.   2. medically necessary excisional debridement including subcutaneous tissue was performed using a tissue nipper and a chisel blade. Excisional debridement of all the necrotic nonviable tissue down to healthy bleeding viable tissue was performed with post-debridement measurements same as pre-. 3. the wound was cleansed and dry sterile dressing applied. 4.  Continue offloading felt pads to offload pressure from the first MTP in all shoes  5.  Continue gentamicin cream and light dressing daily 6.  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 Felecia Shelling, DPM    [address] [place], [place] [address]                Office [phone]  Fax [phone]

## 2021-07-28 ENCOUNTER — Other Ambulatory Visit: Payer: Self-pay

## 2021-07-28 ENCOUNTER — Ambulatory Visit: Payer: BC Managed Care – PPO | Admitting: Podiatry

## 2021-07-28 DIAGNOSIS — E0843 Diabetes mellitus due to underlying condition with diabetic autonomic (poly)neuropathy: Secondary | ICD-10-CM

## 2021-07-28 DIAGNOSIS — L97512 Non-pressure chronic ulcer of other part of right foot with fat layer exposed: Secondary | ICD-10-CM | POA: Diagnosis not present

## 2021-08-06 NOTE — Progress Notes (Signed)
? ?Subjective:  ?56 y.o. male with PMHx of diabetes mellitus presenting for follow-up evaluation of an ulcer to the plantar aspect of the right great toe.  Patient doing well.  There has been no drainage.  He believes the wound is healing nicely.  He has been applying the antibiotic cream and offloading with the felt offloading pads.  He also wears the surgical shoe. ?Patient does state that he developed a new wound to the posterior aspect of the right heel that he would like to have evaluated.  He is applying antibiotic cream and a Band-Aid.  He states that a certain pair of boots for wound in the back of his heel.  He presents for further treatment and evaluation ? ?Past Medical History:  ?Diagnosis Date  ? Diabetes mellitus without complication (HCC)   ? ?Past Surgical History:  ?Procedure Laterality Date  ? LAPAROSCOPIC APPENDECTOMY N/A 08/05/2018  ? Procedure: APPENDECTOMY LAPAROSCOPIC;  Surgeon: Andria Meuse, MD;  Location: [facility];  Service: General;  Laterality: N/A;  ? ?Allergies  ?Allergen Reactions  ? Other Hives  ?  Duck sauce   ? ? ? ?Objective/Physical Exam ?General: The patient is alert and oriented x3 in no acute distress. ? ?Dermatology:  ?Wound #1 noted to the plantar aspect of the right first MTP joint measuring approximately 0.4x0.4 x 0.2 cm (LxWxD).  Overall there is improvement of the wound. there is no eschar. There is a moderate amount of slough, fibrin, and necrotic tissue noted. Granulation tissue and wound base is red. There is a minimal amount of serosanguineous drainage noted. There is no exposed bone muscle-tendon ligament or joint. There is no malodor. Periwound integrity is intact. ?There is a new wound noted to the right posterior heel today measuring approximately 1.2 x 1.2 x 0.1.  No eschar.  Granular wound base.  There is no exposed bone muscle tendon ligament or joint. To the noted ulceration(s),  ?Skin is warm, dry and supple bilateral lower extremities.  Please see above  noted photo ? ?Vascular: Palpable pedal pulses bilaterally. No edema or erythema noted. Capillary refill within normal limits. ? ?Neurological: Epicritic and protective threshold diminished bilaterally.  ? ?Musculoskeletal Exam: No pedal deformity noted ? ?Radiographic exam RT foot 06/30/2021: Normal osseous mineralization.  No radiographic evidence of erosion or periosteal reaction. ? ? ?Assessment: ?1.  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 diabetes mellitus w/ peripheral neuropathy ? ? ?Plan of Care:  ?1. Patient was evaluated.   ?2. medically necessary excisional debridement including subcutaneous tissue was performed using a tissue nipper and a chisel blade. Excisional debridement of all the necrotic nonviable tissue down to healthy bleeding viable tissue was performed with post-debridement measurements same as pre-. ?3. the wound was cleansed and dry sterile dressing applied. ?4.  Continue offloading felt pads to offload pressure from the first MTP in all shoes  ?5.  Continue gentamicin cream and light dressing daily ?6.  Return to clinic in 3 weeks ? 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 ? ?Felecia Shelling, DPM ?[facility] ? ?Dr. Felecia Shelling, DPM  ?  ?[address]                                 ?[place], [place] [address]                ?  Office [phone]  ?Fax [phone] ? ? ? ? ?

## 2021-08-27 ENCOUNTER — Ambulatory Visit: Payer: BC Managed Care – PPO | Admitting: Podiatry

## 2021-08-27 DIAGNOSIS — L97512 Non-pressure chronic ulcer of other part of right foot with fat layer exposed: Secondary | ICD-10-CM | POA: Diagnosis not present

## 2021-08-27 DIAGNOSIS — E0843 Diabetes mellitus due to underlying condition with diabetic autonomic (poly)neuropathy: Secondary | ICD-10-CM

## 2021-08-27 NOTE — Progress Notes (Signed)
? ?  Subjective:  ?56 y.o. male with PMHx of diabetes mellitus presenting for follow-up evaluation of an ulcers to the plantar aspect of the right foot.  Overall the patient says he is doing much better.  He believes the posterior heel wound has completely healed.  He presents for further treatment and evaluation ? ?Past Medical History:  ?Diagnosis Date  ? Diabetes mellitus without complication (HCC)   ? ?Past Surgical History:  ?Procedure Laterality Date  ? LAPAROSCOPIC APPENDECTOMY N/A 08/05/2018  ? Procedure: APPENDECTOMY LAPAROSCOPIC;  Surgeon: Andria Meuse, MD;  [facility]: [facility];  Service: General;  Laterality: N/A;  ? ?Allergies  ?Allergen Reactions  ? Other Hives  ?  Duck sauce   ? ?Objective/Physical Exam ?General: The patient is alert and oriented x3 in no acute distress. ? ?Dermatology:  ?Wound #1 noted to the plantar aspect of the right first MTP joint measuring approximately 0.4x0.4 x 0.1 cm (LxWxD).  Overall there is improvement of the wound. there is no eschar. There is a moderate amount of slough, fibrin, and necrotic tissue noted. Granulation tissue and wound base is red. There is a minimal amount of serosanguineous drainage noted. There is no exposed bone muscle-tendon ligament or joint. There is no malodor. Periwound integrity is intact. ?The wound to the right posterior heel has completely resolved.  Complete reepithelialization has occurred.  Wound healed. ? ?Vascular: Palpable pedal pulses bilaterally. No edema or erythema noted. Capillary refill within normal limits. ? ?Neurological: Epicritic and protective threshold diminished bilaterally.  ? ?Musculoskeletal Exam: No pedal deformity noted ? ?Radiographic exam RT foot 06/30/2021: Normal osseous mineralization.  No radiographic evidence of erosion or periosteal reaction. ? ? ?Assessment: ?1.  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 diabetes mellitus w/ peripheral neuropathy ? ? ?Plan of Care:  ?1. Patient  was evaluated.   ?2. medically necessary excisional debridement including subcutaneous tissue was performed using a tissue nipper and a chisel blade. Excisional debridement of all the necrotic nonviable tissue down to healthy bleeding viable tissue was performed with post-debridement measurements same as pre-. ?3. the wound was cleansed and dry sterile dressing applied. ?4.  Continue offloading felt pads to offload pressure from the first MTP in all shoes  ?5.  Continue gentamicin cream and light dressing daily ?6.  Return to clinic in 3 weeks ? 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 ? ?Felecia Shelling, DPM ?[facility] ? ?Dr. Felecia Shelling, DPM  ?  ?[address]                                 ?[place], [place] [address]                ?Office [phone]  ?Fax [phone] ? ? ? ? ?

## 2021-09-22 ENCOUNTER — Ambulatory Visit: Payer: BC Managed Care – PPO | Admitting: Podiatry

## 2021-09-22 DIAGNOSIS — E0843 Diabetes mellitus due to underlying condition with diabetic autonomic (poly)neuropathy: Secondary | ICD-10-CM

## 2021-09-22 DIAGNOSIS — L97512 Non-pressure chronic ulcer of other part of right foot with fat layer exposed: Secondary | ICD-10-CM | POA: Diagnosis not present

## 2021-09-22 NOTE — Progress Notes (Signed)
? ?  Subjective:  ?56 y.o. male with PMHx of diabetes mellitus presenting for follow-up evaluation of an ulcers to the plantar aspect of the right foot.  Patient continues to do well.  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  No new complaints at this time ? ?Past Medical History:  ?Diagnosis Date  ? Diabetes mellitus without complication (HCC)   ? ?Past Surgical History:  ?Procedure Laterality Date  ? LAPAROSCOPIC APPENDECTOMY N/A 08/05/2018  ? Procedure: APPENDECTOMY LAPAROSCOPIC;  Surgeon: Andria Meuse, MD;  Location: [facility];  Service: General;  Laterality: N/A;  ? ?Allergies  ?Allergen Reactions  ? Other Hives  ?  Duck sauce   ? ?Objective/Physical Exam ?General: The patient is alert and oriented x3 in no acute distress. ? ?Dermatology:  ?Wound #1 noted to the plantar aspect of the right first MTP joint mostly unchanged.  Stable.  Measuring approximately 0.4x0.4 x 0.1 cm (LxWxD).  Overall there is improvement of the wound. there is no eschar. There is a moderate amount of slough, fibrin, and necrotic tissue noted. Granulation tissue and wound base is red. There is a minimal amount of serosanguineous drainage noted. There is no exposed bone muscle-tendon ligament or joint. There is no malodor. Periwound integrity is intact. ?The wound to the right posterior heel has completely resolved.  Complete reepithelialization has occurred.  Wound healed. ? ?Vascular: Palpable pedal pulses bilaterally. No edema or erythema noted. Capillary refill within normal limits. ? ?Neurological: Epicritic and protective threshold diminished bilaterally.  ? ?Musculoskeletal Exam: No pedal deformity noted ? ?Radiographic exam RT foot 06/30/2021: Normal osseous mineralization.  No radiographic evidence of erosion or periosteal reaction. ? ? ?Assessment: ?1.  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 diabetes mellitus w/ peripheral neuropathy ? ? ?Plan of Care:  ?1. Patient  was evaluated.   ?2. medically necessary excisional debridement including subcutaneous tissue was performed using a tissue nipper and a chisel blade. Excisional debridement of all the necrotic nonviable tissue down to healthy bleeding viable tissue was performed with post-debridement measurements same as pre-. ?3. the wound was cleansed and dry sterile dressing applied. ?4.  Continue offloading felt pads to offload pressure from the first MTP in all shoes  ?5.  Continue gentamicin cream and light dressing daily ?6.  Appointment with Pedorthist for custom molded insoles to offload pressure from the great toe ?7.  Return to clinic 4 weeks ? ?*Horses.  Cutting reining and show horses.  ? ? ?Felecia Shelling, DPM ?[facility] ? ?Dr. Felecia Shelling, DPM  ?  ?[address]                                 ?[place], [place] [address]                ?Office [phone]  ?Fax [phone] ? ? ? ? ?

## 2022-02-18 ENCOUNTER — Ambulatory Visit: Payer: BC Managed Care – PPO | Admitting: Podiatry

## 2022-02-18 ENCOUNTER — Ambulatory Visit (INDEPENDENT_AMBULATORY_CARE_PROVIDER_SITE_OTHER): Payer: BC Managed Care – PPO

## 2022-02-18 DIAGNOSIS — L97512 Non-pressure chronic ulcer of other part of right foot with fat layer exposed: Secondary | ICD-10-CM | POA: Diagnosis not present

## 2022-02-18 DIAGNOSIS — E0843 Diabetes mellitus due to underlying condition with diabetic autonomic (poly)neuropathy: Secondary | ICD-10-CM

## 2022-02-18 MED ORDER — GENTAMICIN SULFATE 0.1 % EX CREA: 1.0000 | TOPICAL_CREAM | Freq: Two times a day (BID) | CUTANEOUS | 1 refills | Status: AC

## 2022-02-21 NOTE — Progress Notes (Signed)
Chief Complaint  Patient presents with   Follow-up    Patient  is here for ulcer on right foot that comes and goes and want s the provider to look at it.    Subjective:  56 y.o. male with PMHx of diabetes mellitus presenting today for recurrence of an ulcer that developed to the right foot.  Patient was last seen in the office on 09/22/2021 and at that time the wound had healed.  Patient states of the last few weeks he has noticed recurrence of the wound developing.  Initially started as a callus.  Presenting for further treatment and evaluation   Past Medical History:  Diagnosis Date   Diabetes mellitus without complication [facility])     Past Surgical History:  Procedure Laterality Date   LAPAROSCOPIC APPENDECTOMY N/A 08/05/2018   Procedure: APPENDECTOMY LAPAROSCOPIC;  Surgeon: Ileana Roup, MD;  Location: [facility];  Service: General;  Laterality: N/A;    Allergies  Allergen Reactions   Other Hives    Duck sauce      Objective/Physical Exam General: The patient is alert and oriented x3 in no acute distress.  Dermatology:  Recurrence of an ulcer to the plantar aspect of the first MTP joint right foot measuring approximately 1.0 x 0.6 x 0.5 cm (LxWxD).   To the noted ulceration(s), there is no eschar. There is a moderate amount of slough, fibrin, and necrotic tissue noted. Granulation tissue and wound base is red. There is a minimal amount of serosanguineous drainage noted.  Currently there is no exposed bone muscle-tendon ligament or joint however the wound does probe deep. There is no malodor. Periwound integrity is intact. Skin is warm, dry and supple bilateral lower extremities.  Vascular: Palpable pedal pulses bilaterally. No edema or erythema noted. Capillary refill within normal limits.  Clinically there is no surrounding erythema or edema or concern for deeper underlying cellulitis  Neurological: Light touch and protective threshold diminished bilaterally.    Musculoskeletal Exam: Range of motion within normal limits to all pedal and ankle joints bilateral. Muscle strength 5/5 in all groups bilateral.   Radiographic exam RT foot 02/18/2022: Skin marker placed around the clinical ulcer which demonstrates that it is plantar to the sesamoids.  No osseous erosions or cortical irregularities around the area currently concerning for osteomyelitis.   Assessment: 1.  Ulcer plantar aspect of the first MTP joint RT foot secondary to diabetes mellitus 2. diabetes mellitus w/ peripheral neuropathy   Plan of Care:  1. Patient was evaluated. 2. medically necessary excisional debridement including subcutaneous tissue was performed using a tissue nipper and a chisel blade. Excisional debridement of all the necrotic nonviable tissue down to healthy bleeding viable tissue was performed with post-debridement measurements same as pre-. 3. the wound was cleansed and dry sterile dressing applied. 4.  We did discuss today possible sesamoidectomy and excision of the sesamoid contributing to the plantar foot ulcer since this is a recurrent ulcer that poses him at high risk of infection or possible limb loss.  For now we will pursue conservative wound care to see if we can get the wound to heal or demonstrate improvement prior to any surgery  5.  Refill prescription for gentamicin cream apply daily with a light dressing  6.  Patient is to return to clinic in 2 weeks.   Edrick Kins, DPM [facility]  Dr. Edrick Kins, DPM    [address]  [place], [place] [address]                Office [phone]  Fax [phone]

## 2022-03-11 ENCOUNTER — Ambulatory Visit: Payer: BC Managed Care – PPO | Admitting: Podiatry

## 2022-03-11 DIAGNOSIS — L97512 Non-pressure chronic ulcer of other part of right foot with fat layer exposed: Secondary | ICD-10-CM | POA: Diagnosis not present

## 2022-03-11 DIAGNOSIS — E0843 Diabetes mellitus due to underlying condition with diabetic autonomic (poly)neuropathy: Secondary | ICD-10-CM

## 2022-03-11 NOTE — Addendum Note (Signed)
Addended by: Ammie Ferrier on: 03/11/2022 04:58 PM   Modules accepted: Orders

## 2022-03-11 NOTE — Progress Notes (Signed)
Chief Complaint  Patient presents with   Wound Check    diabetic ulcer on ball of foot    Subjective:  56 y.o. male with PMHx of diabetes mellitus presenting today for recurrence of an ulcer that developed to the right foot.  Overall the patient is doing well.  He has been applying the antibiotic cream and a Band-Aid.  No new complaints at this time  Past Medical History:  Diagnosis Date   Diabetes mellitus without complication [facility])     Past Surgical History:  Procedure Laterality Date   LAPAROSCOPIC APPENDECTOMY N/A 08/05/2018   Procedure: APPENDECTOMY LAPAROSCOPIC;  Surgeon: Ileana Roup, MD;  Location: [facility];  Service: General;  Laterality: N/A;    Allergies  Allergen Reactions   Other Hives    Duck sauce     Objective/Physical Exam General: The patient is alert and oriented x3 in no acute distress.  Dermatology:  Wound stable.  ulcer to the plantar aspect of the first MTP joint right foot measuring approximately 1.0 x 0.6 x 0.5 cm (LxWxD).   To the noted ulceration(s), there is no eschar. There is a moderate amount of slough, fibrin, and necrotic tissue noted. Granulation tissue and wound base is red. There is a minimal amount of serosanguineous drainage noted.  Currently there is no exposed bone muscle-tendon ligament or joint however the wound does probe deep. There is no malodor. Periwound integrity is intact. Skin is warm, dry and supple bilateral lower extremities.  Vascular: Palpable pedal pulses bilaterally. No edema or erythema noted. Capillary refill within normal limits.  Clinically there is no surrounding erythema or edema or concern for deeper underlying cellulitis  Neurological: Light touch and protective threshold diminished bilaterally.   Musculoskeletal Exam: Range of motion within normal limits to all pedal and ankle joints bilateral. Muscle strength 5/5 in all groups bilateral.   Radiographic exam RT foot 02/18/2022: Skin marker placed around  the clinical ulcer which demonstrates that it is plantar to the sesamoids.  No osseous erosions or cortical irregularities around the area currently concerning for osteomyelitis.   Assessment: 1.  Ulcer plantar aspect of the first MTP joint RT foot secondary to diabetes mellitus 2. diabetes mellitus w/ peripheral neuropathy   Plan of Care:  1. Patient was evaluated. 2. medically necessary excisional debridement including subcutaneous tissue was performed using a tissue nipper and a chisel blade. Excisional debridement of all the necrotic nonviable tissue down to healthy bleeding viable tissue was performed with post-debridement measurements same as pre-. 3. the wound was cleansed and dry sterile dressing applied. 4.  Today again we discussed surgery in detail.  Surgery would consist of fibular sesamoidectomy of the right foot.  Patient continues to have recurrent ulcer to this area which can ultimately lead to more proximal limb loss.  Risk benefits advantages and disadvantages were explained.  No guarantees were expressed or implied. 5.  Authorization for surgery was initiated.  Surgery will consist of fibular sesamoidectomy right.  Patient is hoping to have surgery around the first week of December 6.  Return to clinic 1 week postop   Edrick Kins, DPM [facility]  Dr. Edrick Kins, DPM    [address], [place] [address]  Office [phone]  Fax [phone]

## 2022-03-14 LAB — WOUND CULTURE
MICRO NUMBER:: 14130362
SPECIMEN QUALITY:: ADEQUATE

## 2022-04-03 ENCOUNTER — Encounter (HOSPITAL_COMMUNITY): Payer: Self-pay | Admitting: Emergency Medicine

## 2022-04-03 ENCOUNTER — Ambulatory Visit (HOSPITAL_COMMUNITY)
Admission: EM | Admit: 2022-04-03 | Discharge: 2022-04-03 | Disposition: A | Discharge status: Home / Self Care | Payer: BC Managed Care – PPO | Attending: Emergency Medicine | Admitting: Emergency Medicine

## 2022-04-03 DIAGNOSIS — L089 Local infection of the skin and subcutaneous tissue, unspecified: Secondary | ICD-10-CM | POA: Diagnosis not present

## 2022-04-03 MED ORDER — DOXYCYCLINE HYCLATE 100 MG PO CAPS: 100.0000 mg | ORAL_CAPSULE | Freq: Two times a day (BID) | ORAL | 0 refills | Status: DC

## 2022-04-03 NOTE — ED Provider Notes (Signed)
[facility]    CSN: [id number] Arrival date & time: 04/03/22  1136      History   Chief Complaint Chief Complaint  Patient presents with   Wound Check    HPI Casey Figueroa is a 56 y.o. male.   Patient presents for evaluation of increased swelling and erythema to the right fifth finger beginning 3 days ago.  Area of concern was burned about 10 days ago and had been healing without complication.  Has a hole to the center of the finger that he endorses closes and then opens with movement.  Has noticed some drainage.  Has been cleaning daily and wearing gloves whenever completing activities where site could become contaminated.  Denies fevers.  History of diabetes.    Past Medical History:  Diagnosis Date   Diabetes mellitus without complication [facility])     Patient Active Problem List   Diagnosis Date Noted   Appendicitis 08/05/2018   Right foot infection 12/15/2017    Past Surgical History:  Procedure Laterality Date   LAPAROSCOPIC APPENDECTOMY N/A 08/05/2018   Procedure: APPENDECTOMY LAPAROSCOPIC;  Surgeon: Andria Meuse, MD;  Location: [facility];  Service: General;  Laterality: N/A;       Home Medications    Prior to Admission medications   Medication Sig Start Date End Date Taking? Authorizing Provider  atorvastatin (LIPITOR) 10 MG tablet Take 10 mg by mouth daily. 12/14/17   [provider]  dutasteride (AVODART) 0.5 MG capsule Take 0.5 mg by mouth daily. 02/14/21   [provider]  gentamicin cream (GARAMYCIN) 0.1 % Apply 1 Application topically 2 (two) times daily. 02/18/22   Felecia Shelling, DPM  HYDROcodone-acetaminophen (NORCO) 5-325 MG tablet Take 1 tablet by mouth every 4 (four) hours as needed for moderate pain. 04/25/21   Hyman Hopes, Margaux, PA-C  JARDIANCE 25 MG TABS tablet Take 25 mg by mouth daily.  12/02/17   [provider]  lisinopril (PRINIVIL,ZESTRIL) 5 MG tablet Take 5 mg by mouth daily.  12/13/17   [provider]  metFORMIN (GLUCOPHAGE) 1000 MG tablet Take 1,000 mg by mouth 2 (two) times daily.  12/13/17   [provider]  methocarbamol (ROBAXIN) 500 MG tablet Take 1 tablet (500 mg total) by mouth 2 (two) times daily. 04/25/21   Tanda Rockers, PA-C  Nutritional Supplements (PROSTATE PO) Take 2 tablets by mouth in the morning and at bedtime. Prostate Strong    [provider]  OVER THE COUNTER MEDICATION Take 3 tablets by mouth daily. Glucose control    [provider]  trimethoprim-polymyxin b (POLYTRIM) ophthalmic solution SMARTSIG:In Eye(s) 09/21/21   [provider]  vitamin C (ASCORBIC ACID) 500 MG tablet Take 500 mg by mouth daily.    [provider]  Zinc 50 MG CAPS Take 1 capsule by mouth daily.    [provider]    Family History No family history on file.  Social History Social History   Tobacco Use   Smoking status: Never   Smokeless tobacco: Never  Substance Use Topics   Alcohol use: Not Currently   Drug use: Not Currently     Allergies   Other   Review of Systems Review of Systems  Constitutional: Negative.   Respiratory: Negative.    Cardiovascular: Negative.   Skin:  Positive for wound. Negative for color change, pallor and rash.  Neurological: Negative.      Physical Exam Triage Vital Signs ED Triage Vitals [04/03/22 1253]  Enc Vitals Group     BP 135/77     Pulse Rate 80     Resp 17     Temp 98.6 F (37 C)     Temp Source Oral     SpO2 98 %     Weight      Height      Head Circumference      Peak Flow      Pain Score 0     Pain Loc      Pain Edu?      Excl. in GC?    No data found.  Updated Vital Signs BP 135/77 (BP Location: Left Arm)   Pulse 80   Temp 98.6 F (37 C) (Oral)   Resp 17   SpO2 98%   Visual Acuity Right Eye Distance:   Left Eye Distance:   Bilateral Distance:    Right Eye Near:   Left Eye Near:    Bilateral Near:     Physical Exam Constitutional:       Appearance: Normal appearance.  HENT:     Head: Normocephalic.  Eyes:     Extraocular Movements: Extraocular movements intact.  Pulmonary:     Effort: Pulmonary effort is normal.  Skin:    Comments: Erythema and mild to moderate swelling over the middle phalanx of the right fifth finger, skin flaking and peeling to the surrounding skin , less than 0.5 cm puncture to the center, no drainage noted, has full range of motion, sensation intact and capillary refill less than 3, 2+ radial pulse  Neurological:     Mental Status: He is alert and oriented to person, place, and time. Mental status is at baseline.  Psychiatric:        Mood and Affect: Mood normal.        Behavior: Behavior normal.      UC Treatments / Results  Labs (all labs ordered are listed, but only abnormal results are displayed) Labs Reviewed - No data to display  EKG   Radiology No results found.  Procedures Procedures (including critical care time)  Medications Ordered in UC Medications - No data to display  Initial Impression / Assessment and Plan / UC Course  I have reviewed the triage vital signs and the nursing notes.  Pertinent labs & imaging results that were available during my care of the patient were reviewed by me and considered in my medical decision making (see chart for details).  Finger infection  Presentation is consistent with infection, discussed with patient, doxycycline prescribed, advised daily cleansing with diluted soapy water, pat dry and cover with a Band-Aid and continue use of protective gloves when completing activities, currently has full range of motion, low suspicion for joint or bone involvement, given history precautions to follow-up if symptoms persist or worsen Final Clinical Impressions(s) / UC Diagnoses   Final diagnoses:  None   Discharge Instructions   None    ED Prescriptions   None    PDMP not reviewed this encounter.   Valinda Hoar, NP 04/03/22  1313

## 2022-04-03 NOTE — ED Notes (Signed)
Discharged by Dee, CMA.  

## 2022-04-03 NOTE — Discharge Instructions (Signed)
Today you are being treated for a infection to your right pinky finger  Begin use of doxycycline every morning and every evening for 7 days, ideally you will see improvement in about 48 hours and steady progression from there  Cleanse area with soap and water daily, pat dry and then cover with Band-Aid until center hole has closed, continue use of daily protective wear at work  If you have any concerns regarding healing please follow-up for reevaluation

## 2022-04-03 NOTE — ED Triage Notes (Signed)
Pt burned right 5th finger couple weeks ago. Been cleaning and wearing gloves. Hit finger day ago and think could be infected. Finger has swelling and redness.

## 2022-06-22 ENCOUNTER — Telehealth: Payer: Self-pay | Admitting: Urology

## 2022-06-22 NOTE — Telephone Encounter (Signed)
DOS - 07/09/22  SESAMOIDECTOMY RIGHT --- [id number]  [employer] EFFECTIVE DATE 05/11/22  SPOKE [place] AND SHE STATED THAT FOR CPT CODE 95188 HAS BEEN APPROVED, AUTH # [id number], GOOD FROM 07/09/22 - 09/06/22.  REF # Denyce Robert CASE # [id number]

## 2022-07-09 ENCOUNTER — Encounter: Payer: Self-pay | Admitting: Podiatry

## 2022-07-09 ENCOUNTER — Other Ambulatory Visit: Payer: Self-pay | Admitting: Podiatry

## 2022-07-09 DIAGNOSIS — M84871 Other disorders of continuity of bone, right ankle and foot: Secondary | ICD-10-CM | POA: Diagnosis not present

## 2022-07-09 MED ORDER — DOXYCYCLINE HYCLATE 100 MG PO CAPS: 100.0000 mg | ORAL_CAPSULE | Freq: Two times a day (BID) | ORAL | 0 refills | Status: AC

## 2022-07-10 ENCOUNTER — Telehealth: Payer: Self-pay

## 2022-07-10 NOTE — Telephone Encounter (Signed)
POST OP CALL-    1) General condition stated by the patient: Patient is doing well   2) Is the pt having pain? No   3) Pain score: 0  4) Has the pt taken Rx'd pain medication, regularly or PRN? No   5) Is the pain medication giving relief? N/a  6) Any fever, chills, nausea, or vomiting, shortness of breath or tightness in calf? No   7) Is the bandage clean, dry and intact? Yes   8) Is there excessive tightness, bleeding or drainage coming through the bandage? No   9) Did you understand all of the post op instruction sheet given? Yes   10) Any questions or concerns regarding post op care/recovery? No not at this time     Confirmed POV appointment with patient    Wed @ 8:15am

## 2022-07-15 ENCOUNTER — Ambulatory Visit (INDEPENDENT_AMBULATORY_CARE_PROVIDER_SITE_OTHER): Payer: BC Managed Care – PPO

## 2022-07-15 ENCOUNTER — Ambulatory Visit (INDEPENDENT_AMBULATORY_CARE_PROVIDER_SITE_OTHER): Payer: BC Managed Care – PPO | Admitting: Podiatry

## 2022-07-15 DIAGNOSIS — Z9889 Other specified postprocedural states: Secondary | ICD-10-CM | POA: Diagnosis not present

## 2022-07-15 NOTE — Progress Notes (Signed)
   No chief complaint on file.   Subjective:  Patient presents today status post fibular exostectomy of the right foot.  Patient doing very well.  Dressings clean dry and intact.  WBAT cam boot.  Past Medical History:  Diagnosis Date   Diabetes mellitus without complication [facility])     Past Surgical History:  Procedure Laterality Date   LAPAROSCOPIC APPENDECTOMY N/A 08/05/2018   Procedure: APPENDECTOMY LAPAROSCOPIC;  Surgeon: Ileana Roup, MD;  Location: [facility] OR;  Service: General;  Laterality: N/A;    Allergies  Allergen Reactions   Other Hives    Duck sauce     Objective/Physical Exam Neurovascular status intact.  Incision well coapted with sutures intact. No sign of infectious process noted. No dehiscence. No active bleeding noted.  No edema noted.  Overall well-healing surgical foot  Radiographic Exam RT foot 07/15/2022:  Absence of the fibular sesamoid noted.  No gas within the tissues.  No fractures.  Mild to moderate degenerative changes but overall stable foot radiographically  Assessment: 1. s/p fibular sesamoidectomy right foot. DOS: 07/09/2022   Plan of Care:  1. Patient was evaluated. X-rays reviewed 2.  Dressings changed.  Patient may begin washing and showering and getting the foot wet 3.  Continue WBAT cam boot 4.  Return to clinic 2 weeks suture removal   Edrick Kins, DPM [facility]  Dr. Edrick Kins, DPM    [address], [place] [address]                Office [phone]  Fax [phone]

## 2022-07-22 ENCOUNTER — Encounter: Payer: BC Managed Care – PPO | Admitting: Podiatry

## 2022-07-31 ENCOUNTER — Ambulatory Visit (INDEPENDENT_AMBULATORY_CARE_PROVIDER_SITE_OTHER): Payer: BC Managed Care – PPO | Admitting: Podiatry

## 2022-07-31 DIAGNOSIS — Z9889 Other specified postprocedural states: Secondary | ICD-10-CM

## 2022-07-31 NOTE — Progress Notes (Signed)
   Chief Complaint  Patient presents with   Routine Post Op    POV # 2 DOS 07/09/22 --- FIBULAR SESMOIDECTOMY RIGHT FOOT, no N/V/F/C/SOB, Patient denies any pain, sutures removed     Subjective:  Patient presents today status post fibular exostectomy of the right foot.  Patient doing very well.  Dressings clean dry and intact.  WBAT cam boot.  Past Medical History:  Diagnosis Date   Diabetes mellitus without complication [facility])     Past Surgical History:  Procedure Laterality Date   LAPAROSCOPIC APPENDECTOMY N/A 08/05/2018   Procedure: APPENDECTOMY LAPAROSCOPIC;  Surgeon: Ileana Roup, MD;  Location: [place];  Service: General;  Laterality: N/A;    Allergies  Allergen Reactions   Other Hives    Duck sauce     RT foot 07/31/2022  Objective/Physical Exam Neurovascular status intact.  Incision well coapted with sutures intact. No sign of infectious process noted.  There is a superficial wound which was along the incision site after debridement of the overlying callus.  Please see above noted photo.  It appears very superficial with a granular wound base and good potential for healing.  There is hyperkeratotic callus with intermixed dried blood in the callus tissue also noted especially along the medial aspect of the foot.  Radiographic Exam RT foot 07/15/2022:  Absence of the fibular sesamoid noted.  No gas within the tissues.  No fractures.  Mild to moderate degenerative changes but overall stable foot radiographically  Assessment: 1. s/p fibular sesamoidectomy right foot. DOS: 07/09/2022   Plan of Care:  1. Patient was evaluated.  2.  Sutures removed 3.  Light debridement of the callus tissue around the freshly healed incision site and wound was performed today using a 312 scalpel. 4.  Continue antibiotic cream with a dressing 5.  Patient may now transition slowly out of the cam boot into good supportive tennis shoes and sneakers with the orthotics with offloading first  MTP 6 return to clinic 4 weeks   Edrick Kins, DPM [facility]  Dr. Edrick Kins, DPM    [address], [place] [address]                Office [phone]  Fax [phone]

## 2022-08-05 ENCOUNTER — Encounter: Payer: BC Managed Care – PPO | Admitting: Podiatry

## 2022-08-31 ENCOUNTER — Ambulatory Visit (INDEPENDENT_AMBULATORY_CARE_PROVIDER_SITE_OTHER): Payer: BC Managed Care – PPO | Admitting: Podiatry

## 2022-08-31 ENCOUNTER — Ambulatory Visit (INDEPENDENT_AMBULATORY_CARE_PROVIDER_SITE_OTHER): Payer: BC Managed Care – PPO

## 2022-08-31 DIAGNOSIS — L97512 Non-pressure chronic ulcer of other part of right foot with fat layer exposed: Secondary | ICD-10-CM | POA: Diagnosis not present

## 2022-08-31 DIAGNOSIS — Z9889 Other specified postprocedural states: Secondary | ICD-10-CM

## 2022-08-31 NOTE — Progress Notes (Signed)
   Chief Complaint  Patient presents with   Routine Post Op    POV # 3 DOS 07/09/22 --- FIBULAR SESMOIDECTOMY RIGHT FOOT    Subjective:  Patient presents today status post fibular exostectomy of the right foot.  Patient doing very well.  He is full activity with no restrictions to the foot.  [place] with [place] complaint  Past Medical History:  Diagnosis Date   Diabetes mellitus without complication [facility])     Past Surgical History:  Procedure Laterality Date   LAPAROSCOPIC APPENDECTOMY N/A 08/05/2018   Procedure: APPENDECTOMY LAPAROSCOPIC;  Surgeon: Andria Meuse, MD;  Location: [facility] OR;  Service: General;  Laterality: N/A;    Allergies  Allergen Reactions   Other Hives    Duck sauce      Objective/Physical Exam Neurovascular status intact.  Incision nicely healed.  There is some callus tissue to the plantar aspect of the first MTP but there is no open wound.  The wound appears to have healed completely  Radiographic Exam RT foot 07/15/2022:  Absence of the fibular sesamoid noted.  No gas within the tissues.  No fractures.  Mild to moderate degenerative changes but overall stable foot radiographically  Assessment: 1. s/p fibular sesamoidectomy right foot. DOS: 07/09/2022  -Light debridement of the callus area was performed today using a tissue nipper and 312 scalpel -Patient may now resume full activity no restrictions.  Continue wearing prefabricated OTC arch supports with offloading of the first MTP using felt dancers pads -Return to clinic as needed   Felecia Shelling, DPM [facility]  Dr. Felecia Shelling, DPM    [address] [place], [place] [address]                Office [phone]  Fax [phone]

## 2022-09-08 ENCOUNTER — Other Ambulatory Visit: Payer: Self-pay | Admitting: Podiatry

## 2022-09-08 DIAGNOSIS — Z9889 Other specified postprocedural states: Secondary | ICD-10-CM

## 2022-09-08 DIAGNOSIS — L97512 Non-pressure chronic ulcer of other part of right foot with fat layer exposed: Secondary | ICD-10-CM

## 2023-03-03 ENCOUNTER — Encounter: Payer: Self-pay | Admitting: Podiatry

## 2023-03-03 ENCOUNTER — Ambulatory Visit: Payer: BC Managed Care – PPO | Admitting: Podiatry

## 2023-03-03 DIAGNOSIS — L97512 Non-pressure chronic ulcer of other part of right foot with fat layer exposed: Secondary | ICD-10-CM | POA: Diagnosis not present

## 2023-03-03 NOTE — Progress Notes (Signed)
   Chief Complaint  Patient presents with   Routine Post Op    DOS 07/09/22 --- FIBULAR SESMOIDECTOMY RIGHT FOOT   "Its just a 6 month follow. I'm not having any pain, no problems"    HPI: 57 y.o. male presenting today for follow-up evaluation after fibular sesamoidectomy to the right foot.  DOS: 07/09/2022.  Patient has no pain or tenderness and states that he is doing very well.  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  Past Medical History:  Diagnosis Date   Diabetes mellitus without complication [facility])     Past Surgical History:  Procedure Laterality Date   LAPAROSCOPIC APPENDECTOMY N/A 08/05/2018   Procedure: APPENDECTOMY LAPAROSCOPIC;  Surgeon: Andria Meuse, MD;  Location: [facility] OR;  Service: General;  Laterality: N/A;    Allergies  Allergen Reactions   Other Hives    Duck sauce      Physical Exam: General: The patient is alert and oriented x3 in no acute distress.  Dermatology: Skin is warm, dry and supple bilateral lower extremities.  There are some callus tissue around the area but no open wound or tenderness  Vascular: Palpable pedal pulses bilaterally. Capillary refill within normal limits.  No appreciable edema.  No erythema.  Neurological: Grossly intact via light touch  Musculoskeletal Exam: No pedal deformities noted  Assessment/Plan of Care: 1. H/o fibular sesamoidectomy RT foot.  DOS: 07/09/2022  -Normal foot exam today.  Patient is doing very well -Recommend good supportive shoes and sneakers -Continue OTC power step insoles -Return to clinic as needed       Felecia Shelling, DPM [facility]  Dr. Felecia Shelling, DPM    [address] [place], [place] [address]                Office [phone]  Fax [phone]
